# Patient Record
Sex: Male | Born: 1960
Health system: Southern US, Community
[De-identification: ages and names within clinical notes are randomized; demographics above are authoritative.]

## PROBLEM LIST (undated history)

## (undated) DIAGNOSIS — M47812 Spondylosis without myelopathy or radiculopathy, cervical region: Secondary | ICD-10-CM

## (undated) DIAGNOSIS — R413 Other amnesia: Secondary | ICD-10-CM

## (undated) DIAGNOSIS — K2270 Barrett's esophagus without dysplasia: Secondary | ICD-10-CM

## (undated) DIAGNOSIS — R300 Dysuria: Secondary | ICD-10-CM

## (undated) DIAGNOSIS — M5010 Cervical disc disorder with radiculopathy, unspecified cervical region: Secondary | ICD-10-CM

## (undated) DIAGNOSIS — I729 Aneurysm of unspecified site: Secondary | ICD-10-CM

## (undated) DIAGNOSIS — N411 Chronic prostatitis: Secondary | ICD-10-CM

## (undated) DIAGNOSIS — D649 Anemia, unspecified: Secondary | ICD-10-CM

## (undated) DIAGNOSIS — M21372 Foot drop, left foot: Secondary | ICD-10-CM

## (undated) DIAGNOSIS — M069 Rheumatoid arthritis, unspecified: Secondary | ICD-10-CM

## (undated) HISTORY — DX: Spondylosis without myelopathy or radiculopathy, cervical region: M47.812

## (undated) HISTORY — DX: Foot drop, left foot: M21.372

## (undated) HISTORY — DX: Other amnesia: R41.3

## (undated) HISTORY — DX: Chronic prostatitis: N41.1

## (undated) HISTORY — DX: Aneurysm of unspecified site: I72.9

## (undated) HISTORY — DX: Barrett's esophagus without dysplasia: K22.70

## (undated) HISTORY — PX: NOSE SURGERY: SHX723

## (undated) HISTORY — DX: Anemia, unspecified: D64.9

## (undated) HISTORY — DX: Cervical disc disorder with radiculopathy, unspecified cervical region: M50.10

## (undated) HISTORY — DX: Dysuria: R30.0

## (undated) HISTORY — PX: CERVICAL FUSION: SHX112

---

## 1998-06-17 ENCOUNTER — Emergency Department (HOSPITAL_COMMUNITY): Admission: EM | Admit: 1998-06-17 | Discharge: 1998-06-18 | Payer: Self-pay | Admitting: Emergency Medicine

## 1998-06-17 ENCOUNTER — Encounter: Payer: Self-pay | Admitting: Emergency Medicine

## 1998-08-17 ENCOUNTER — Ambulatory Visit (HOSPITAL_COMMUNITY): Admission: RE | Admit: 1998-08-17 | Discharge: 1998-08-17 | Payer: Self-pay | Admitting: Gastroenterology

## 1998-08-26 ENCOUNTER — Encounter: Payer: Self-pay | Admitting: Gastroenterology

## 1998-08-26 ENCOUNTER — Ambulatory Visit (HOSPITAL_COMMUNITY): Admission: RE | Admit: 1998-08-26 | Discharge: 1998-08-26 | Payer: Self-pay | Admitting: Gastroenterology

## 1999-10-08 ENCOUNTER — Emergency Department (HOSPITAL_COMMUNITY): Admission: EM | Admit: 1999-10-08 | Discharge: 1999-10-08 | Payer: Self-pay | Admitting: Emergency Medicine

## 2000-09-16 ENCOUNTER — Emergency Department (HOSPITAL_COMMUNITY): Admission: EM | Admit: 2000-09-16 | Discharge: 2000-09-16 | Payer: Self-pay | Admitting: Emergency Medicine

## 2000-09-20 ENCOUNTER — Emergency Department (HOSPITAL_COMMUNITY): Admission: EM | Admit: 2000-09-20 | Discharge: 2000-09-20 | Payer: Self-pay | Admitting: Emergency Medicine

## 2001-07-09 ENCOUNTER — Emergency Department (HOSPITAL_COMMUNITY): Admission: EM | Admit: 2001-07-09 | Discharge: 2001-07-09 | Payer: Self-pay | Admitting: *Deleted

## 2001-07-09 ENCOUNTER — Encounter: Payer: Self-pay | Admitting: Emergency Medicine

## 2001-07-09 ENCOUNTER — Encounter: Payer: Self-pay | Admitting: *Deleted

## 2003-02-23 ENCOUNTER — Emergency Department (HOSPITAL_COMMUNITY): Admission: EM | Admit: 2003-02-23 | Discharge: 2003-02-23 | Payer: Self-pay | Admitting: Emergency Medicine

## 2007-02-15 ENCOUNTER — Emergency Department (HOSPITAL_COMMUNITY): Admission: EM | Admit: 2007-02-15 | Discharge: 2007-02-15 | Payer: Self-pay | Admitting: Emergency Medicine

## 2010-07-09 ENCOUNTER — Emergency Department (HOSPITAL_COMMUNITY)
Admission: EM | Admit: 2010-07-09 | Discharge: 2010-07-09 | Disposition: A | Discharge status: Home / Self Care | Payer: Self-pay | Attending: Emergency Medicine | Admitting: Emergency Medicine

## 2010-07-09 ENCOUNTER — Emergency Department (HOSPITAL_COMMUNITY)
Admission: EM | Admit: 2010-07-09 | Discharge: 2010-07-10 | Disposition: A | Discharge status: Home / Self Care | Payer: Self-pay | Attending: Emergency Medicine | Admitting: Emergency Medicine

## 2010-07-09 ENCOUNTER — Emergency Department (HOSPITAL_COMMUNITY): Payer: Self-pay

## 2010-07-09 DIAGNOSIS — M25473 Effusion, unspecified ankle: Secondary | ICD-10-CM | POA: Insufficient documentation

## 2010-07-09 DIAGNOSIS — S99919A Unspecified injury of unspecified ankle, initial encounter: Secondary | ICD-10-CM | POA: Insufficient documentation

## 2010-07-09 DIAGNOSIS — R209 Unspecified disturbances of skin sensation: Secondary | ICD-10-CM | POA: Insufficient documentation

## 2010-07-09 DIAGNOSIS — M25476 Effusion, unspecified foot: Secondary | ICD-10-CM | POA: Insufficient documentation

## 2010-07-09 DIAGNOSIS — M069 Rheumatoid arthritis, unspecified: Secondary | ICD-10-CM | POA: Insufficient documentation

## 2010-07-09 DIAGNOSIS — S8990XA Unspecified injury of unspecified lower leg, initial encounter: Secondary | ICD-10-CM | POA: Insufficient documentation

## 2010-07-09 DIAGNOSIS — M25579 Pain in unspecified ankle and joints of unspecified foot: Secondary | ICD-10-CM | POA: Insufficient documentation

## 2010-07-09 DIAGNOSIS — S93409A Sprain of unspecified ligament of unspecified ankle, initial encounter: Secondary | ICD-10-CM | POA: Insufficient documentation

## 2010-07-09 DIAGNOSIS — S99929A Unspecified injury of unspecified foot, initial encounter: Secondary | ICD-10-CM | POA: Insufficient documentation

## 2010-07-09 DIAGNOSIS — W1789XA Other fall from one level to another, initial encounter: Secondary | ICD-10-CM | POA: Insufficient documentation

## 2010-07-09 DIAGNOSIS — E7800 Pure hypercholesterolemia, unspecified: Secondary | ICD-10-CM | POA: Insufficient documentation

## 2010-10-20 ENCOUNTER — Inpatient Hospital Stay (INDEPENDENT_AMBULATORY_CARE_PROVIDER_SITE_OTHER)
Admission: RE | Admit: 2010-10-20 | Discharge: 2010-10-20 | Disposition: A | Discharge status: Home / Self Care | Payer: Self-pay | Source: Ambulatory Visit | Attending: Emergency Medicine | Admitting: Emergency Medicine

## 2010-10-20 DIAGNOSIS — R6889 Other general symptoms and signs: Secondary | ICD-10-CM

## 2010-11-12 ENCOUNTER — Ambulatory Visit (INDEPENDENT_AMBULATORY_CARE_PROVIDER_SITE_OTHER): Payer: Self-pay

## 2010-11-12 ENCOUNTER — Inpatient Hospital Stay (INDEPENDENT_AMBULATORY_CARE_PROVIDER_SITE_OTHER)
Admission: RE | Admit: 2010-11-12 | Discharge: 2010-11-12 | Disposition: A | Discharge status: Home / Self Care | Payer: Self-pay | Source: Ambulatory Visit | Attending: Family Medicine | Admitting: Family Medicine

## 2010-11-12 DIAGNOSIS — H9209 Otalgia, unspecified ear: Secondary | ICD-10-CM

## 2010-11-12 DIAGNOSIS — G542 Cervical root disorders, not elsewhere classified: Secondary | ICD-10-CM

## 2010-11-12 DIAGNOSIS — F659 Paraphilia, unspecified: Secondary | ICD-10-CM

## 2010-11-12 DIAGNOSIS — R3919 Other difficulties with micturition: Secondary | ICD-10-CM

## 2010-11-12 LAB — POCT URINALYSIS DIP (DEVICE)
Bilirubin Urine: NEGATIVE
Glucose, UA: NEGATIVE mg/dL
Hgb urine dipstick: NEGATIVE
Leukocytes, UA: NEGATIVE
Nitrite: NEGATIVE
Protein, ur: NEGATIVE mg/dL
Specific Gravity, Urine: 1.02 (ref 1.005–1.030)
Urobilinogen, UA: 0.2 mg/dL (ref 0.0–1.0)
pH: 6 (ref 5.0–8.0)

## 2010-11-13 LAB — GC/CHLAMYDIA PROBE AMP, GENITAL
Chlamydia, DNA Probe: NEGATIVE
GC Probe Amp, Genital: NEGATIVE

## 2010-11-13 LAB — HIV ANTIBODY (ROUTINE TESTING W REFLEX): HIV: NONREACTIVE

## 2010-12-29 ENCOUNTER — Other Ambulatory Visit: Payer: Self-pay | Admitting: Neurosurgery

## 2010-12-29 DIAGNOSIS — M479 Spondylosis, unspecified: Secondary | ICD-10-CM

## 2011-01-14 ENCOUNTER — Ambulatory Visit
Admission: RE | Admit: 2011-01-14 | Discharge: 2011-01-14 | Disposition: A | Discharge status: Home / Self Care | Payer: BC Managed Care – PPO | Source: Ambulatory Visit | Attending: Neurosurgery | Admitting: Neurosurgery

## 2011-01-14 DIAGNOSIS — M479 Spondylosis, unspecified: Secondary | ICD-10-CM

## 2011-01-31 ENCOUNTER — Other Ambulatory Visit: Payer: Self-pay | Admitting: Internal Medicine

## 2011-02-25 ENCOUNTER — Other Ambulatory Visit: Payer: Self-pay | Admitting: Internal Medicine

## 2011-02-27 ENCOUNTER — Other Ambulatory Visit: Payer: Self-pay | Admitting: Internal Medicine

## 2011-04-20 ENCOUNTER — Encounter (HOSPITAL_COMMUNITY): Payer: Self-pay | Admitting: *Deleted

## 2011-04-20 ENCOUNTER — Emergency Department (HOSPITAL_COMMUNITY)
Admission: EM | Admit: 2011-04-20 | Discharge: 2011-04-20 | Disposition: A | Discharge status: Home / Self Care | Payer: BC Managed Care – PPO | Attending: Emergency Medicine | Admitting: Emergency Medicine

## 2011-04-20 DIAGNOSIS — G8918 Other acute postprocedural pain: Secondary | ICD-10-CM

## 2011-04-20 DIAGNOSIS — M542 Cervicalgia: Secondary | ICD-10-CM | POA: Insufficient documentation

## 2011-04-20 DIAGNOSIS — R209 Unspecified disturbances of skin sensation: Secondary | ICD-10-CM | POA: Insufficient documentation

## 2011-04-20 DIAGNOSIS — R609 Edema, unspecified: Secondary | ICD-10-CM | POA: Insufficient documentation

## 2011-04-20 DIAGNOSIS — M79609 Pain in unspecified limb: Secondary | ICD-10-CM | POA: Insufficient documentation

## 2011-04-20 DIAGNOSIS — Z981 Arthrodesis status: Secondary | ICD-10-CM | POA: Insufficient documentation

## 2011-04-20 HISTORY — DX: Rheumatoid arthritis, unspecified: M06.9

## 2011-04-20 NOTE — ED Notes (Signed)
Pt states "I had recent cervical fusion, it was all good x 6 days, was given steroids by an on call neurosurgeon but then my neurosurgeon d/c'd it because I was already on steroids for my RA, it's like he just washed his wands of me, so my primary care is going to take care of things until I can get in with the new rheumatologist, that appt is 10 days now; have pain in my right arm and am experiencing weakness I've never had before"

## 2011-04-20 NOTE — ED Provider Notes (Signed)
History     CSN: [id number]  Arrival date & time 04/20/11  1359   First MD Initiated Contact with Patient 04/20/11 1810      HPI Patient reports a history of a cervical fusion 6 days ago. Reports neurosurgeon the anterior approach. States since then he had initial improvement. However within the last 6 days has had worsening numbness and pain. Points to pain located at C6 and states feels as forearm. Also reports increasing weakness. Is uncertain if this is post surgical versus his rheumatoid arthritis. Denies fever, difficulty swallowing, shortness of breath, sore throat. Reports prednisone the pain however soon as medication wears off it returns. Patient is a 51 y.o. male presenting with shoulder pain. The history is provided by the patient.  Shoulder Pain This is a new problem. The current episode started in the past 7 days. The problem occurs constantly. The problem has been gradually worsening. Associated symptoms include neck pain, numbness and weakness. Pertinent negatives include no chills, fever, headaches, nausea, rash, sore throat or vomiting. He has tried oral narcotics for the symptoms.    Past Medical History  Diagnosis Date  . Rheumatoid arthritis   . Attention deficit disorder     Past Surgical History  Procedure Date  . Cervical fusion     No family history on file.  History  Substance Use Topics  . Smoking status: Former Games developer  . Smokeless tobacco: Not on file  . Alcohol Use: No      Review of Systems  Constitutional: Negative for fever and chills.  HENT: Positive for neck pain. Negative for sore throat and neck stiffness.   Gastrointestinal: Negative for nausea and vomiting.  Musculoskeletal: Negative for back pain.  Skin: Negative for rash.  Neurological: Positive for weakness and numbness. Negative for headaches.  All other systems reviewed and are negative.    Allergies  Review of patient's allergies indicates no known allergies.  Home  Medications   Current Outpatient Rx  Name Route Sig Dispense Refill  . ATOMOXETINE HCL 60 MG PO CAPS Oral Take 120 mg by mouth daily. Pt takes 2 capsules for 120 mg dose    . GABAPENTIN 600 MG PO TABS Oral Take 600 mg by mouth 3 (three) times daily.    Marland Kitchen PREDNISONE 50 MG PO TABS Oral Take 50 mg by mouth every 4 (four) hours.      BP 142/95  Pulse 107  Temp(Src) 98.1 F (36.7 C) (Oral)  Resp 20  Wt 185 lb (83.915 kg)  SpO2 98%  Physical Exam  Constitutional: He is oriented to person, place, and time. He appears well-developed and well-nourished.  HENT:  Head: Normocephalic and atraumatic.  Right Ear: External ear normal.  Left Ear: External ear normal.  Nose: Nose normal.  Mouth/Throat: Oropharynx is clear and moist. No oropharyngeal exudate.  Eyes: Pupils are equal, round, and reactive to light.  Neck: Neck supple. Spinous process tenderness present. No muscular tenderness present. No rigidity. Decreased range of motion present.         Mass or abscess palpated. Patient had anterior cervical fusion. Surgical scar present. Wound healing well. No signs of wound dehiscence, erythema, drainage. Patient has pain reproduced with palpation of C7. Patient states he feels the pain in his right forearm reports best pain relief with hyperextension of the shoulder.  Musculoskeletal:       Decreased strength of right-sided hand grip compared to left. Mild edema and noticeable in right shoulder. Normal sensation.  Neurological:  He is alert and oriented to person, place, and time.  Skin: Skin is warm and dry. No rash noted. No erythema. No pallor.  Psychiatric: He has a normal mood and affect. His behavior is normal.    ED Course  Procedures   MDM    I scheduled patient to return tomorrow for an MRI. Patient states he does not want to have it done today because he has to be at work and 30 minutes and he has been waiting for 4 hours. Patient is reasonable and does not request any pain  medications. States he is  just concerned due to new numbness and weakness. Patient does arrive for his MRI at 6:45 PM tomorrow morning.      Thomasene Lot, PA-C 04/25/11 1044

## 2011-04-21 ENCOUNTER — Ambulatory Visit (HOSPITAL_COMMUNITY)
Admit: 2011-04-21 | Discharge: 2011-04-21 | Disposition: A | Discharge status: Home / Self Care | Payer: BC Managed Care – PPO | Attending: Emergency Medicine | Admitting: Emergency Medicine

## 2011-04-21 DIAGNOSIS — M79609 Pain in unspecified limb: Secondary | ICD-10-CM | POA: Insufficient documentation

## 2011-04-21 DIAGNOSIS — R29898 Other symptoms and signs involving the musculoskeletal system: Secondary | ICD-10-CM | POA: Insufficient documentation

## 2011-04-21 DIAGNOSIS — R209 Unspecified disturbances of skin sensation: Secondary | ICD-10-CM | POA: Insufficient documentation

## 2011-04-21 DIAGNOSIS — M129 Arthropathy, unspecified: Secondary | ICD-10-CM | POA: Insufficient documentation

## 2011-04-21 DIAGNOSIS — Z981 Arthrodesis status: Secondary | ICD-10-CM | POA: Insufficient documentation

## 2011-04-21 MED ORDER — GADOBENATE DIMEGLUMINE 529 MG/ML IV SOLN
17.0000 mL | Freq: Once | INTRAVENOUS | Status: AC | PRN
  Administered 2011-04-21: 17 mL via INTRAVENOUS

## 2011-04-26 NOTE — ED Provider Notes (Signed)
Medical screening examination/treatment/procedure(s) were performed by non-physician practitioner and as supervising physician I was immediately available for consultation/collaboration.   Kamaya Keckler, MD 04/26/11 0900 

## 2011-05-17 ENCOUNTER — Other Ambulatory Visit: Payer: Self-pay | Admitting: Neurology

## 2011-05-17 DIAGNOSIS — G35 Multiple sclerosis: Secondary | ICD-10-CM

## 2011-05-24 ENCOUNTER — Ambulatory Visit
Admission: RE | Admit: 2011-05-24 | Discharge: 2011-05-24 | Disposition: A | Discharge status: Home / Self Care | Payer: BC Managed Care – PPO | Source: Ambulatory Visit | Attending: Neurology | Admitting: Neurology

## 2011-05-24 DIAGNOSIS — G35 Multiple sclerosis: Secondary | ICD-10-CM

## 2011-06-14 ENCOUNTER — Other Ambulatory Visit: Payer: Self-pay | Admitting: Neurosurgery

## 2011-06-14 DIAGNOSIS — M5410 Radiculopathy, site unspecified: Secondary | ICD-10-CM

## 2011-06-14 DIAGNOSIS — M542 Cervicalgia: Secondary | ICD-10-CM

## 2011-06-21 ENCOUNTER — Ambulatory Visit: Payer: BC Managed Care – PPO | Admitting: Family

## 2011-06-24 ENCOUNTER — Ambulatory Visit
Admission: RE | Admit: 2011-06-24 | Discharge: 2011-06-24 | Disposition: A | Discharge status: Home / Self Care | Payer: BC Managed Care – PPO | Source: Ambulatory Visit | Attending: Neurosurgery | Admitting: Neurosurgery

## 2011-06-24 VITALS — BP 124/85 | HR 91

## 2011-06-24 DIAGNOSIS — M542 Cervicalgia: Secondary | ICD-10-CM

## 2011-06-24 DIAGNOSIS — M5410 Radiculopathy, site unspecified: Secondary | ICD-10-CM

## 2011-06-24 MED ORDER — DIAZEPAM 5 MG PO TABS
10.0000 mg | ORAL_TABLET | Freq: Once | ORAL | Status: AC
  Administered 2011-06-24: 10 mg via ORAL

## 2011-06-24 MED ORDER — IOHEXOL 300 MG/ML  SOLN
10.0000 mL | Freq: Once | INTRAMUSCULAR | Status: AC | PRN
  Administered 2011-06-24: 10 mL via INTRATHECAL

## 2011-06-24 NOTE — Discharge Instructions (Signed)

## 2011-07-12 ENCOUNTER — Other Ambulatory Visit: Payer: Self-pay | Admitting: Orthopaedic Surgery

## 2011-07-12 DIAGNOSIS — M21379 Foot drop, unspecified foot: Secondary | ICD-10-CM

## 2011-07-12 DIAGNOSIS — M542 Cervicalgia: Secondary | ICD-10-CM

## 2011-07-13 ENCOUNTER — Other Ambulatory Visit: Payer: Self-pay | Admitting: Orthopaedic Surgery

## 2011-07-16 ENCOUNTER — Ambulatory Visit
Admission: RE | Admit: 2011-07-16 | Discharge: 2011-07-16 | Disposition: A | Discharge status: Home / Self Care | Payer: BC Managed Care – PPO | Source: Ambulatory Visit | Attending: Orthopaedic Surgery | Admitting: Orthopaedic Surgery

## 2011-07-16 DIAGNOSIS — M21379 Foot drop, unspecified foot: Secondary | ICD-10-CM

## 2011-07-16 DIAGNOSIS — M542 Cervicalgia: Secondary | ICD-10-CM

## 2011-07-20 ENCOUNTER — Other Ambulatory Visit: Payer: Self-pay | Admitting: Orthopaedic Surgery

## 2011-07-20 DIAGNOSIS — IMO0002 Reserved for concepts with insufficient information to code with codable children: Secondary | ICD-10-CM

## 2011-07-25 ENCOUNTER — Inpatient Hospital Stay: Admission: RE | Admit: 2011-07-25 | Payer: BC Managed Care – PPO | Source: Ambulatory Visit

## 2011-07-26 ENCOUNTER — Ambulatory Visit
Admission: RE | Admit: 2011-07-26 | Discharge: 2011-07-26 | Disposition: A | Discharge status: Home / Self Care | Payer: BC Managed Care – PPO | Source: Ambulatory Visit | Attending: Orthopaedic Surgery | Admitting: Orthopaedic Surgery

## 2011-07-26 DIAGNOSIS — IMO0002 Reserved for concepts with insufficient information to code with codable children: Secondary | ICD-10-CM

## 2012-03-23 ENCOUNTER — Emergency Department (INDEPENDENT_AMBULATORY_CARE_PROVIDER_SITE_OTHER)
Admission: EM | Admit: 2012-03-23 | Discharge: 2012-03-23 | Disposition: A | Discharge status: Home / Self Care | Payer: 59 | Source: Home / Self Care | Attending: Family Medicine | Admitting: Family Medicine

## 2012-03-23 ENCOUNTER — Encounter (HOSPITAL_COMMUNITY): Payer: Self-pay | Admitting: Emergency Medicine

## 2012-03-23 DIAGNOSIS — B001 Herpesviral vesicular dermatitis: Secondary | ICD-10-CM

## 2012-03-23 DIAGNOSIS — B009 Herpesviral infection, unspecified: Secondary | ICD-10-CM

## 2012-03-23 MED ORDER — ACYCLOVIR 400 MG PO TABS: 800.0000 mg | ORAL_TABLET | Freq: Three times a day (TID) | ORAL | Status: DC

## 2012-03-23 NOTE — ED Provider Notes (Signed)
History     CSN: [id number]  Arrival date & time 03/23/12  1811   First MD Initiated Contact with Patient 03/23/12 1931      Chief Complaint  Patient presents with  . Blister    on mouth  . Lymphadenopathy    (Consider location/radiation/quality/duration/timing/severity/associated sxs/prior treatment) The history is provided by the patient.  pt reports blister noted to bottom lip yesterday, states he believes it is getting bigger.  Spoke with primary care provider and was sent to [facility] for further evaluation.  Additionally complains of swollen lymph node in neck.  No known fever, sore throat or uri.  No hx of same.    Past Medical History  Diagnosis Date  . Rheumatoid arthritis   . Attention deficit disorder     Past Surgical History  Procedure Date  . Cervical fusion     History reviewed. No pertinent family history.  History  Substance Use Topics  . Smoking status: Former Games developer  . Smokeless tobacco: Not on file  . Alcohol Use: No      Review of Systems  All other systems reviewed and are negative.    Allergies  Review of patient's allergies indicates no known allergies.  Home Medications   Current Outpatient Rx  Name  Route  Sig  Dispense  Refill  . ATOMOXETINE HCL 60 MG PO CAPS   Oral   Take 120 mg by mouth daily. Pt takes 2 capsules for 120 mg dose         . BACLOFEN PO   Oral   Take by mouth.         . CELEBREX PO   Oral   Take by mouth.         Marland Kitchen ROXICODONE PO   Oral   Take by mouth.         Marland Kitchen PREDNISONE 50 MG PO TABS   Oral   Take 50 mg by mouth every 4 (four) hours.         Marland Kitchen PREGABALIN 150 MG PO CAPS   Oral   Take 150 mg by mouth 2 (two) times daily.         . ACYCLOVIR 400 MG PO TABS   Oral   Take 2 tablets (800 mg total) by mouth 3 (three) times daily. For 7 days   50 tablet   0   . GABAPENTIN 600 MG PO TABS   Oral   Take 600 mg by mouth 3 (three) times daily.           BP 115/67  Pulse 80  Temp 98.3  F (36.8 C) (Oral)  Resp 20  SpO2 98%  Physical Exam  Nursing note and vitals reviewed. Constitutional: He is oriented to person, place, and time. Vital signs are normal. He appears well-developed and well-nourished. He is active and cooperative. No distress.  HENT:  Head: Normocephalic.  Right Ear: External ear normal. A middle ear effusion is present.  Left Ear: Tympanic membrane and external ear normal.  Nose: Nose normal. Right sinus exhibits no maxillary sinus tenderness and no frontal sinus tenderness. Left sinus exhibits no maxillary sinus tenderness and no frontal sinus tenderness.  Mouth/Throat: Uvula is midline, oropharynx is clear and moist and mucous membranes are normal.    Eyes: Conjunctivae normal are normal. Pupils are equal, round, and reactive to light. No scleral icterus.  Neck: Trachea normal and normal range of motion. Neck supple.  Cardiovascular: Normal rate, regular rhythm, normal heart  sounds, intact distal pulses and normal pulses.   Pulmonary/Chest: Effort normal and breath sounds normal.  Lymphadenopathy:       Head (right side): No submental, no submandibular, no tonsillar, no preauricular, no posterior auricular and no occipital adenopathy present.       Head (left side): Tonsillar adenopathy present. No submental, no submandibular, no preauricular, no posterior auricular and no occipital adenopathy present.    He has no cervical adenopathy.  Neurological: He is alert and oriented to person, place, and time. No cranial nerve deficit or sensory deficit.  Skin: Skin is warm and dry. He is not diaphoretic.  Psychiatric: He has a normal mood and affect. His speech is normal and behavior is normal. Judgment and thought content normal. Cognition and memory are normal.    ED Course  Procedures (including critical care time)  Labs Reviewed - No data to display No results found.   1. Fever blister       MDM  Medication as prescribed, pt declined hsv lab.   Will treat empirically with acyclovir per pt request.  Follow up with pcp if symptoms are not improving.        Johnsie Kindred, NP 04/01/12 1104

## 2012-03-23 NOTE — ED Notes (Signed)
Reports fever blister on mouth since yesterday.  C/o lymph nodes swollen which started this morning.

## 2012-04-03 NOTE — ED Provider Notes (Signed)
Medical screening examination/treatment/procedure(s) were performed by resident physician or non-physician practitioner and as supervising physician I was immediately available for consultation/collaboration.   Barkley Bruns MD.    Linna Hoff, MD 04/03/12 1246

## 2012-04-08 ENCOUNTER — Encounter (HOSPITAL_COMMUNITY): Payer: Self-pay | Admitting: *Deleted

## 2012-04-08 ENCOUNTER — Emergency Department (INDEPENDENT_AMBULATORY_CARE_PROVIDER_SITE_OTHER)
Admission: EM | Admit: 2012-04-08 | Discharge: 2012-04-08 | Disposition: A | Discharge status: Home / Self Care | Payer: 59 | Source: Home / Self Care | Attending: Emergency Medicine | Admitting: Emergency Medicine

## 2012-04-08 DIAGNOSIS — J029 Acute pharyngitis, unspecified: Secondary | ICD-10-CM

## 2012-04-08 LAB — POCT RAPID STREP A: Streptococcus, Group A Screen (Direct): NEGATIVE

## 2012-04-08 MED ORDER — PENICILLIN V POTASSIUM 500 MG PO TABS: 500.0000 mg | ORAL_TABLET | Freq: Four times a day (QID) | ORAL | Status: AC

## 2012-04-08 NOTE — ED Provider Notes (Signed)
History     CSN: [id number]  Arrival date & time 04/08/12  1116   First MD Initiated Contact with Patient 04/08/12 1212      Chief Complaint  Patient presents with  . Sore Throat    (Consider location/radiation/quality/duration/timing/severity/associated sxs/prior treatment) Patient is a 51 y.o. male presenting with pharyngitis. The history is provided by the patient. No language interpreter was used.  Sore Throat This is a new problem. The problem occurs constantly. The problem has been gradually worsening. Pertinent negatives include no headaches and no shortness of breath. Nothing aggravates the symptoms. Nothing relieves the symptoms.  Pt reports he was exposed to strep     Past Medical History  Diagnosis Date  . Rheumatoid arthritis   . Attention deficit disorder     Past Surgical History  Procedure Date  . Cervical fusion     No family history on file.  History  Substance Use Topics  . Smoking status: Former Games developer  . Smokeless tobacco: Not on file  . Alcohol Use: No      Review of Systems  HENT: Positive for sore throat.   Respiratory: Negative for shortness of breath.   Neurological: Negative for headaches.  All other systems reviewed and are negative.    Allergies  Review of patient's allergies indicates no known allergies.  Home Medications   Current Outpatient Rx  Name  Route  Sig  Dispense  Refill  . ACYCLOVIR 400 MG PO TABS   Oral   Take 2 tablets (800 mg total) by mouth 3 (three) times daily. For 7 days   50 tablet   0   . ATOMOXETINE HCL 60 MG PO CAPS   Oral   Take 120 mg by mouth daily. Pt takes 2 capsules for 120 mg dose         . BACLOFEN PO   Oral   Take by mouth.         . CELEBREX PO   Oral   Take by mouth.         Marland Kitchen GABAPENTIN 600 MG PO TABS   Oral   Take 600 mg by mouth 3 (three) times daily.         Marland Kitchen ROXICODONE PO   Oral   Take by mouth.         Marland Kitchen PREDNISONE 50 MG PO TABS   Oral   Take 50 mg by  mouth every 4 (four) hours.         Marland Kitchen PREGABALIN 150 MG PO CAPS   Oral   Take 150 mg by mouth 2 (two) times daily.           BP 126/84  Pulse 82  Temp 98.8 F (37.1 C) (Oral)  SpO2 99%  Physical Exam  Nursing note and vitals reviewed. Constitutional: He is oriented to person, place, and time. He appears well-developed and well-nourished.  HENT:  Head: Normocephalic and atraumatic.  Right Ear: External ear normal.  Left Ear: External ear normal.  Nose: Nose normal.  Mouth/Throat: Oropharynx is clear and moist.  Eyes: Conjunctivae normal and EOM are normal. Pupils are equal, round, and reactive to light.  Neck: Normal range of motion. Neck supple.  Cardiovascular: Normal rate.   Pulmonary/Chest: Effort normal.  Musculoskeletal: Normal range of motion.  Neurological: He is alert and oriented to person, place, and time. He has normal reflexes.  Skin: Skin is warm.  Psychiatric: He has a normal mood and affect.  ED Course  Procedures (including critical care time)   Labs Reviewed  POCT RAPID STREP A ([facility] ONLY)   No results found.   No diagnosis found.    MDM  Strep negative   I will treat with pcn          Elson Areas, PA 04/08/12 1325

## 2012-04-08 NOTE — ED Notes (Addendum)
Patient complains of sore throat and head congestion, body aches x 1 day. Hurts to swallow. Patient denies fever/chills, nausea/vomiting, diarrhea.

## 2012-04-08 NOTE — ED Provider Notes (Signed)
Medical screening examination/treatment/procedure(s) were performed by non-physician practitioner and as supervising physician I was immediately available for consultation/collaboration.  Abrianna Sidman, M.D.   Maci Eickholt C Lekendrick Alpern, MD 04/08/12 2046 

## 2012-04-18 DIAGNOSIS — Z5181 Encounter for therapeutic drug level monitoring: Secondary | ICD-10-CM | POA: Insufficient documentation

## 2012-04-18 DIAGNOSIS — R4701 Aphasia: Secondary | ICD-10-CM | POA: Insufficient documentation

## 2012-04-18 DIAGNOSIS — R413 Other amnesia: Secondary | ICD-10-CM | POA: Insufficient documentation

## 2012-05-18 ENCOUNTER — Other Ambulatory Visit: Payer: Self-pay | Admitting: Orthopaedic Surgery

## 2012-05-18 DIAGNOSIS — M5030 Other cervical disc degeneration, unspecified cervical region: Secondary | ICD-10-CM

## 2012-05-24 ENCOUNTER — Other Ambulatory Visit: Payer: 59

## 2012-05-24 ENCOUNTER — Inpatient Hospital Stay
Admission: RE | Admit: 2012-05-24 | Discharge: 2012-05-24 | Disposition: A | Discharge status: Home / Self Care | Payer: 59 | Source: Ambulatory Visit | Attending: Orthopaedic Surgery | Admitting: Orthopaedic Surgery

## 2012-06-01 ENCOUNTER — Ambulatory Visit
Admission: RE | Admit: 2012-06-01 | Discharge: 2012-06-01 | Disposition: A | Discharge status: Home / Self Care | Payer: 59 | Source: Ambulatory Visit | Attending: Orthopaedic Surgery | Admitting: Orthopaedic Surgery

## 2012-06-01 DIAGNOSIS — M5030 Other cervical disc degeneration, unspecified cervical region: Secondary | ICD-10-CM

## 2012-06-01 MED ORDER — IOHEXOL 300 MG/ML  SOLN
10.0000 mL | Freq: Once | INTRAMUSCULAR | Status: AC | PRN
  Administered 2012-06-01: 10 mL via INTRATHECAL

## 2012-06-01 NOTE — Progress Notes (Signed)
Pt refused valium

## 2012-06-01 NOTE — Progress Notes (Signed)
Pt has been off bupropion for the past 2 days but did not have to stop the strattera.

## 2012-08-06 ENCOUNTER — Telehealth: Payer: Self-pay | Admitting: *Deleted

## 2012-08-06 NOTE — Telephone Encounter (Signed)
Message left that we received a referral from Dr. Nicholos Johns requesting a sleep consult.  Please call to schedule with Dr. Vickey Huger.

## 2012-08-08 ENCOUNTER — Encounter: Payer: Self-pay | Admitting: Neurology

## 2012-08-08 ENCOUNTER — Encounter (INDEPENDENT_AMBULATORY_CARE_PROVIDER_SITE_OTHER): Payer: 59 | Admitting: Neurology

## 2012-08-08 DIAGNOSIS — R413 Other amnesia: Secondary | ICD-10-CM

## 2012-08-08 DIAGNOSIS — R4701 Aphasia: Secondary | ICD-10-CM

## 2012-08-08 NOTE — Progress Notes (Signed)
Reason for visit: Word finding problems  Charles Stevens is an 52 y.o. male  History of present illness:  Charles Stevens is a 52 year old right-handed white male with a history of problems with concentration and word finding that dates back several months. The patient has had blood work that was unremarkable. The patient was set up for MRI evaluation of the brain, but this was never done. The patient indicates that since he was here last, he has cut back on the Lyrica taking only 75 mg daily. The patient has had an improvement in his work finding problems and concentration abilities. The patient however, goes on to say that he has been referred for a sleep study. The patient has been snoring at night, and he has had observed apnea at night while sleeping. The patient wakes up feeling "foggy headed" in the morning, and he has a headache that lasts about 1 hour in the morning. The patient has a job that alters his sleep wake cycle, occasionally working at night, and occasionally working during the day. The patient returns for an evaluation.  Past Medical History  Diagnosis Date  . Rheumatoid arthritis   . Attention deficit disorder   . Memory loss   . Cervical spondylosis   . Dysuria     Hx. of Dysuria  . Chronic prostatitis     Possible chronic prostatitis    Past Surgical History  Procedure Laterality Date  . Cervical fusion      Family History  Problem Relation Age of Onset  . Cancer Mother     Social history:  reports that he has quit smoking. His smoking use included Pipe. He does not have any smokeless tobacco history on file. He reports that he does not drink alcohol or use illicit drugs.  Allergies: No Known Allergies  Medications:  Current Outpatient Prescriptions on File Prior to Visit  Medication Sig Dispense Refill  . atomoxetine (STRATTERA) 60 MG capsule Take 120 mg by mouth daily. Pt takes 2 capsules for 120 mg dose      . pregabalin (LYRICA) 150 MG capsule Take 150 mg  by mouth daily.        No current facility-administered medications on file prior to visit.    ROS:  Out of a complete 14 system review of symptoms, the patient complains only of the following symptoms, and all other reviewed systems are negative.  Fatigue Blurred vision Concentration problems  Blood pressure 132/70, pulse 89, height 5\' 7"  (1.702 m), weight 165 lb (74.844 kg).  Physical Exam  General: The patient is alert and cooperative at the time of the examination.  Skin: No significant peripheral edema is noted.   Neurologic Exam  Cranial nerves: Facial symmetry is present. Speech is normal, no aphasia or dysarthria is noted. Extraocular movements are full. Visual fields are full.  Motor: The patient has good strength in all 4 extremities.  Coordination: The patient has good finger-nose-finger and heel-to-shin bilaterally.  Gait and station: The patient has a normal gait. Tandem gait is normal. Romberg is negative. No drift is seen.  Reflexes: Deep tendon reflexes are symmetric.   Assessment/Plan:  1. Mild memory disturbance, word finding problems  2. Probable sleep apnea  The patient has early morning headaches, and a history of snoring and observed apnea at night while sleeping. The patient likely has obstructive sleep apnea. The patient also has a work schedule that inverts the sleep wake cycle from nighttime to the daytime and back again.  The patient has difficulty sleeping because of this. The patient has been referred for a sleep study, and this is an appropriate next step in his workup. The patient will followup with me on an as-needed basis. If sleep apnea is noted, and treatment does not improve the symptoms, we may go on to do the MRI of the brain in the future.  Marlan Palau MD 08/08/2012 7:41 PM  [facility] [address] [place], [place] [address]  Phone [phone] Fax [phone]

## 2012-08-08 NOTE — Progress Notes (Signed)
This encounter was created in error - please disregard.

## 2012-08-10 ENCOUNTER — Telehealth: Payer: Self-pay | Admitting: *Deleted

## 2012-08-10 NOTE — Telephone Encounter (Signed)
Message left that we received a referral from Dr. Nicholos Johns.  Please call to schedule a Sleep Consult with Dr. Vickey Huger

## 2012-08-15 ENCOUNTER — Telehealth: Payer: Self-pay | Admitting: *Deleted

## 2012-08-15 NOTE — Telephone Encounter (Signed)
Message left that we are following up on a referral from Dr. Nicholos Johns requesting an appointment.  Please call to schedule.

## 2012-08-18 ENCOUNTER — Telehealth: Payer: Self-pay | Admitting: *Deleted

## 2012-08-18 NOTE — Telephone Encounter (Signed)
I called and left a message for the patient to callback on Monday to the sleep lab to schedule sleep consult with Dr. Vickey Huger.

## 2012-08-31 ENCOUNTER — Encounter: Payer: Self-pay | Admitting: Neurology

## 2012-08-31 ENCOUNTER — Ambulatory Visit (INDEPENDENT_AMBULATORY_CARE_PROVIDER_SITE_OTHER): Payer: No Typology Code available for payment source | Admitting: Neurology

## 2012-08-31 VITALS — BP 128/86 | HR 76 | Temp 98.4°F | Ht 72.0 in | Wt 179.1 lb

## 2012-08-31 DIAGNOSIS — R0609 Other forms of dyspnea: Secondary | ICD-10-CM

## 2012-08-31 DIAGNOSIS — R413 Other amnesia: Secondary | ICD-10-CM | POA: Insufficient documentation

## 2012-08-31 DIAGNOSIS — R0683 Snoring: Secondary | ICD-10-CM

## 2012-08-31 DIAGNOSIS — G4710 Hypersomnia, unspecified: Secondary | ICD-10-CM

## 2012-08-31 DIAGNOSIS — R0989 Other specified symptoms and signs involving the circulatory and respiratory systems: Secondary | ICD-10-CM

## 2012-08-31 NOTE — Addendum Note (Signed)
Addended by: Melvyn Novas on: 08/31/2012 03:19 PM   Modules accepted: Orders

## 2012-08-31 NOTE — Patient Instructions (Addendum)
Sleep Apnea Sleep apnea is disorder that affects a person's sleep. A person with sleep apnea has abnormal pauses in their breathing when they sleep. It is hard for them to get a good sleep. This makes a person tired during the day. It also can lead to other physical problems. There are three types of sleep apnea. One type is when breathing stops for a short time because your airway is blocked (obstructive sleep apnea). Another type is when the brain sometimes fails to give the normal signal to breathe to the muscles that control your breathing (central sleep apnea). The third type is a combination of the other two types. HOME CARE  Do not sleep on your back. Try to sleep on your side.  Take all medicine as told by your doctor.  Avoid alcohol, calming medicines (sedatives), and depressant drugs.  Try to lose weight if you are overweight. Talk to your doctor about a healthy weight goal. Your doctor may have you use a device that helps to open your airway. It can help you get the air that you need. It is called a positive airway pressure (PAP) device. There are three types of PAP devices:  Continuous positive airway pressure (CPAP) device.  Nasal expiratory positive airway pressure (EPAP) device.  Bilevel positive airway pressure (BPAP) device. MAKE SURE YOU:  Understand these instructions.  Will watch your condition.  Will get help right away if you are not doing well or get worse. Document Released: 01/05/2008 Document Revised: 03/14/2012 Document Reviewed: 07/30/2011 [facility] Patient Information 2014 [place], [place]. Hypersomnia Hypersomnia usually brings recurrent episodes of excessive daytime sleepiness or prolonged nighttime sleep. It is different than feeling tired due to lack of or interrupted sleep at night. People with hypersomnia are compelled to nap repeatedly during the day. This is often at inappropriate times such as:  At work.  During a meal.  In conversation. These  daytime naps usually provide no relief. This disorder typically affects adolescents and young adults. CAUSES  This condition may be caused by:  Another sleep disorder (such as narcolepsy or sleep apnea).  Dysfunction of the autonomic nervous system.  Drug or alcohol abuse.  A physical problem, such as:  A tumor.  Head trauma. This is damage caused by an accident.  Injury to the central nervous system.  Certain medications, or medicine withdrawal.  Medical conditions may contribute to the disorder, including:  Multiple sclerosis.  Depression.  Encephalitis.  Epilepsy.  Obesity.  Some people appear to have a genetic predisposition to this disorder. In others, there is no known cause. SYMPTOMS   Patients often have difficulty waking from a long sleep. They may feel dazed or confused.  Other symptoms may include:  Anxiety.  Increased irritation (inflammation).  Decreased energy.  Restlessness.  Slow thinking.  Slow speech.  Loss of appetite.  Hallucinations.  Memory difficulty.  Tremors, Tics.  Some patients lose the ability to function in family, social, occupational, or other settings. TREATMENT  Treatment is symptomatic in nature. Stimulants and other drugs may be used to treat this disorder. Changes in behavior may help. For example, avoid night work and social activities that delay bed time. Changes in diet may offer some relief. Patients should avoid alcohol and caffeine. PROGNOSIS  The likely outcome (prognosis) for persons with hypersomnia depends on the cause of the disorder. The disorder itself is not life threatening. But it can have serious consequences. For example, automobile accidents can be caused by falling asleep while driving. The  attacks usually continue indefinitely. Document Released: 03/18/2002 Document Revised: 06/20/2011 Document Reviewed: 02/20/2008 [facility] Patient Information 2014 [place], [place].

## 2012-08-31 NOTE — Progress Notes (Addendum)
[facility]  Provider:  Dr Jayleen Scaglione Referring Provider: Georgianne Fick, MD Primary Care Physician:  Charles Fick, MD  Chief Complaint  Patient presents with  . Insomnia    Sleep apnea #10    HPI:  Charles Stevens is a 52 y.o. male here as a referral from Dr. Nicholos Stevens for  Sleep apnea evaluation.  He is an established patient with Dr Charles Stevens at [facility], for memory loss and word finding problems. Charles Stevens a 20-year-old right-handed Caucasian male has a history of a DD, multiple brain concussions and progressive difficulties with word finding. The patient was sent to Dr. Anne Stevens for evaluation of his word finding problems and memory issues in generators thousand 14 by his primary care physician, Dr. Dicky Stevens from. Dr. Anne Stevens established that the patient had a high level of fatigue. The patient endorsed today the Epworth Sleepiness Scale at 9-10 points, his fatigue severity score however was 56. The patient is taking Strattera and is in the process of changing to Adderall he is also on Voltaren , Celebrex Deltasone and oxycodone as of his last visit. He is  On HUMIRA for the treatment of rheumatoid arthritis.  He follows with  Dr Docia Chuck.  The patient works at [employer] , in second shift.   The patient scored  29/30 points in his MMSE and was supposed to follow up with Dr. Anne Stevens for evaluation of  demyelination disease. He has outstanding tests to go through with under Dr Charles Stevens guidance.   The patient was to bed usually around midnight, he may asleep fall asleep as quicker than 2-3 minutes sometimes longer. He has chronic prostatitis and as long as he's treated with antibiotics he has normal to a. He has been told that he is a loud snorer and apneas have have been witnessed. Is awoken himself in supine position from snoring The claves to sleep for up to 12 hours which is not a realistic goal for him, but he feels not refreshed after 7 or 8 hours of sleep. His  girlfriend has a different javelin sometimes wakes him up inadvertently. Again he feels not refreshed even after 7 or 8 hours of uninterrupted sleep. He is highly fatigued rather than having sleep attacks. On some days he will take a daytime nap, he drinks red bull and coffee a lot all day he states to not have sudden sleep attacks or he resisted the urge to fall asleep.  Without caffeine or energy drinks he will fall asleep promptly if he lies down or sits down. Than his Epworth is 16 or higher.   Patient considers himself excessively daytime sleepy since ever he can remember certainly he had examples at 52 years of age for he fell asleep promptly and unprovoked.            Review of Systems: Out of a complete 14 system review, the patient complains of only the following symptoms, and all other reviewed systems are negative. EDS 9-16 points.  Fatigue autoimmune disease.  History   Social History  . Marital Status: Single    Spouse Name: N/A    Number of Children: 0  . Years of Education: assoc   Occupational History  . [employer]    Social History Main Topics  . Smoking status: Former Smoker    Types: Pipe  . Smokeless tobacco: Not on file     Comment: Quit in April  . Alcohol Use: No  . Drug Use: No  . Sexually Active:  Not on file   Other Topics Concern  . Not on file   Social History Narrative  . No narrative on file    Family History  Problem Relation Age of Onset  . Cancer Mother     Past Medical History  Diagnosis Date  . Rheumatoid arthritis   . Attention deficit disorder   . Memory loss   . Cervical spondylosis   . Dysuria     Hx. of Dysuria  . Chronic prostatitis     Possible chronic prostatitis  . Memory loss of unknown cause     dr Charles Stevens 2014 . NP Darrol Angel     Past Surgical History  Procedure Laterality Date  . Cervical fusion      Current Outpatient Prescriptions  Medication Sig Dispense Refill  . baclofen (LIORESAL) 10  MG tablet Take 5 mg by mouth 3 (three) times daily.      . celecoxib (CELEBREX) 200 MG capsule Take 200 mg by mouth daily.      Marland Kitchen HUMIRA PEN 40 MG/0.8ML injection Inject 40 mg into the skin every 14 (fourteen) days.       Marland Kitchen oxyCODONE (ROXICODONE) 15 MG immediate release tablet Take 15 mg by mouth 2 (two) times daily as needed for pain.      . predniSONE (DELTASONE) 5 MG tablet Take 5 mg by mouth daily.      . pregabalin (LYRICA) 150 MG capsule Take 150 mg by mouth daily.       . tadalafil (CIALIS) 10 MG tablet Take 10 mg by mouth daily as needed for erectile dysfunction.      . VOLTAREN 1 % GEL Apply 2 g topically 2 (two) times daily as needed.        No current facility-administered medications for this visit.    Allergies as of 08/31/2012  . (No Known Allergies)    Vitals: BP 128/86  Pulse 76  Temp(Src) 98.4 F (36.9 C) (Oral)  Ht 6' (1.829 m)  Wt 179 lb 1.6 oz (81.239 kg)  BMI 24.28 kg/m2 Last Weight:  Wt Readings from Last 1 Encounters:  08/31/12 179 lb 1.6 oz (81.239 kg)   Last Height:   Ht Readings from Last 1 Encounters:  08/31/12 6' (1.829 m)     Physical exam:  General: The patient is awake, alert and appears not in acute distress. The patient is well groomed. Head: Normocephalic, atraumatic.  Neck is supple. Mallampati 2 , neck circumference:14.5 , a sugars a history of neck surgery the no sinus or jaw or nasal surgeries., Nor retrognathia.  History of TMJ,  Clicking in the  Left jaw.  Cardiovascular:  Regular rate and rhythm, without  murmurs or carotid bruit, and without distended neck veins. Respiratory: Lungs are clear to auscultation. Skin:  Without evidence of edema, or rash Trunk: BMI is normal .  Neurologic exam : The patient is awake and alert, oriented to place and time.  Memory subjective described as impaired . There is a normal attention span & concentration ability. Speech is fluent without dysarthria, dysphonia . Mood and affect are  Depressed,  fatigued.   Cranial nerves: Pupils are equal and briskly reactive to light. Funduscopic exam without  evidence of pallor or edema. Extraocular movements  in vertical and horizontal planes intact and without nystagmus. Visual fields by finger perimetry are intact. Hearing to finger rub intact.  Facial sensation intact to fine touch. Facial motor strength is symmetric and tongue and uvula move midline.  Motor exam:  Normal tone and normal muscle bulk and symmetric normal strength in all extremities.  Sensory:  Fine touch, pinprick and vibration were tested in all extremities. He has hand numbness-  Proprioception is tested in the upper extremities only. This was  normal.  Coordination: Rapid alternating movements in the fingers/hands is tested and normal.  Gait and station: Patient walks without assistive deviceDeep tendon reflexes: in the  upper and lower extremities are symmetric and intact.   Assessment:  After physical and neurologic examination, review of laboratory studies, imaging, neurophysiology testing and pre-existing records, assessment will be reviewed on the problem list.  Plan:  Treatment plan and additional workup will be reviewed under Problem List.  Discussion of possible apnea  , health  risks  Sec. to OSA , and treatment options  35 minutes.   Patient has an excessively long uvula that touches the tongue ground, but he does not have a lateral narrow airway. TMJ will make use of a dental device less tolerated.

## 2012-09-12 ENCOUNTER — Encounter: Payer: No Typology Code available for payment source | Admitting: Neurology

## 2012-09-19 ENCOUNTER — Ambulatory Visit (INDEPENDENT_AMBULATORY_CARE_PROVIDER_SITE_OTHER): Payer: No Typology Code available for payment source | Admitting: Neurology

## 2012-09-19 VITALS — BP 133/87 | HR 65 | Ht 72.0 in | Wt 180.0 lb

## 2012-09-19 DIAGNOSIS — G4701 Insomnia due to medical condition: Secondary | ICD-10-CM

## 2012-09-19 DIAGNOSIS — G4710 Hypersomnia, unspecified: Secondary | ICD-10-CM

## 2012-09-19 DIAGNOSIS — G4713 Recurrent hypersomnia: Secondary | ICD-10-CM

## 2012-09-19 DIAGNOSIS — R0683 Snoring: Secondary | ICD-10-CM

## 2012-10-07 ENCOUNTER — Telehealth: Payer: Self-pay | Admitting: *Deleted

## 2012-10-07 NOTE — Telephone Encounter (Signed)
Called patient to discuss sleep study results from 09/19/2012.  Discussed findings, recommendations and follow up care.  Patient understood well and all questions were answered.  Pt is scheduled to follow up with Dr. Vickey Huger on 10/16/2012 and he is aware of that appt.  A referral has been sent to Dr. Haroldine Laws (ENT physician) and pt states they are waiting for a copy of the sleep study report in order to schedule him.  I faxed a report to their office tonight.  A copy was also sent to Dr. Nicholos Johns.  Pt is aware he will receive a copy of this report as well.  Pt explains he is concerned he didn't sleep on his back during this study and he states that while he tries not to sleep on his back, he always ends up there and he is aware that he stops breathing when on his back.  This also causes his uvula to swell due to irritation, which contributes to further airway obstruction.  Tech noted that pt stated he never sleeps on his back during his study.  Pt will be sure to discuss his concerns that he does turn to his back during sleep with his appt with Dr. Vickey Huger.  Pt has tonsils and would be happy to have them removed to make airway larger.

## 2012-10-08 NOTE — Progress Notes (Signed)
See media tab for full report  

## 2012-10-16 ENCOUNTER — Ambulatory Visit: Payer: No Typology Code available for payment source | Admitting: Neurology

## 2013-04-02 ENCOUNTER — Other Ambulatory Visit: Payer: Self-pay | Admitting: Orthopaedic Surgery

## 2013-04-02 DIAGNOSIS — M542 Cervicalgia: Secondary | ICD-10-CM

## 2013-04-18 ENCOUNTER — Other Ambulatory Visit: Payer: 59

## 2013-04-18 ENCOUNTER — Inpatient Hospital Stay
Admission: RE | Admit: 2013-04-18 | Discharge: 2013-04-18 | Disposition: A | Discharge status: Home / Self Care | Payer: 59 | Source: Ambulatory Visit | Attending: Orthopaedic Surgery | Admitting: Orthopaedic Surgery

## 2013-04-18 NOTE — Discharge Instructions (Signed)
Myelogram Discharge Instructions  1. Go home and rest quietly for the next 24 hours.  It is important to lie flat for the next 24 hours.  Get up only to go to the restroom.  You may lie in the bed or on a couch on your back, your stomach, your left side or your right side.  You may have one pillow under your head.  You may have pillows between your knees while you are on your side or under your knees while you are on your back.  2. DO NOT drive today.  Recline the seat as far back as it will go, while still wearing your seat belt, on the way home.  3. You may get up to go to the bathroom as needed.  You may sit up for 10 minutes to eat.  You may resume your normal diet and medications unless otherwise indicated.  Drink plenty of extra fluids today and tomorrow.  4. The incidence of a spinal headache with nausea and/or vomiting is about 5% (one in 20 patients).  If you develop a headache, lie flat and drink plenty of fluids until the headache goes away.  Caffeinated beverages may be helpful.  If you develop severe nausea and vomiting or a headache that does not go away with flat bed rest, call [phone].  5. You may resume normal activities after your 24 hours of bed rest is over; however, do not exert yourself strongly or do any heavy lifting tomorrow.  6. Call your physician for a follow-up appointment.   You may resume Strattera on Friday, April 19, 2013 after 1:00p.m.

## 2013-04-25 ENCOUNTER — Ambulatory Visit
Admission: RE | Admit: 2013-04-25 | Discharge: 2013-04-25 | Disposition: A | Discharge status: Home / Self Care | Payer: 59 | Source: Ambulatory Visit | Attending: Orthopaedic Surgery | Admitting: Orthopaedic Surgery

## 2013-04-25 DIAGNOSIS — M542 Cervicalgia: Secondary | ICD-10-CM

## 2013-04-25 MED ORDER — IOHEXOL 300 MG/ML  SOLN
10.0000 mL | Freq: Once | INTRAMUSCULAR | Status: AC | PRN
  Administered 2013-04-25: 10 mL via INTRATHECAL

## 2013-04-25 NOTE — Discharge Instructions (Signed)
Myelogram Discharge Instructions  1. Go home and rest quietly for the next 24 hours.  It is important to lie flat for the next 24 hours.  Get up only to go to the restroom.  You may lie in the bed or on a couch on your back, your stomach, your left side or your right side.  You may have one pillow under your head.  You may have pillows between your knees while you are on your side or under your knees while you are on your back.  2. DO NOT drive today.  Recline the seat as far back as it will go, while still wearing your seat belt, on the way home.  3. You may get up to go to the bathroom as needed.  You may sit up for 10 minutes to eat.  You may resume your normal diet and medications unless otherwise indicated.  Drink plenty of extra fluids today and tomorrow.  4. The incidence of a spinal headache with nausea and/or vomiting is about 5% (one in 20 patients).  If you develop a headache, lie flat and drink plenty of fluids until the headache goes away.  Caffeinated beverages may be helpful.  If you develop severe nausea and vomiting or a headache that does not go away with flat bed rest, call [phone].  5. You may resume normal activities after your 24 hours of bed rest is over; however, do not exert yourself strongly or do any heavy lifting tomorrow.  6. Call your physician for a follow-up appointment.   You may resume Strattera on Friday, April 26, 2013 after 9:30a.m.

## 2013-04-25 NOTE — Progress Notes (Signed)
Patient does not care for any valium prior to procedure.  jkl

## 2013-04-25 NOTE — Progress Notes (Signed)
Patient states he has been off Strattera for the past two days.  Dr. Carlota Raspberry in to speak with patient and his wife about procedure and to obtain consent.  jkl

## 2013-05-15 ENCOUNTER — Other Ambulatory Visit: Payer: Self-pay | Admitting: Otolaryngology

## 2013-05-19 ENCOUNTER — Emergency Department (HOSPITAL_COMMUNITY)
Admission: EM | Admit: 2013-05-19 | Discharge: 2013-05-19 | Disposition: A | Discharge status: Home / Self Care | Payer: 59 | Source: Home / Self Care | Attending: Emergency Medicine | Admitting: Emergency Medicine

## 2013-05-19 ENCOUNTER — Encounter (HOSPITAL_COMMUNITY): Payer: Self-pay | Admitting: Emergency Medicine

## 2013-05-19 DIAGNOSIS — J029 Acute pharyngitis, unspecified: Secondary | ICD-10-CM

## 2013-05-19 LAB — POCT RAPID STREP A: Streptococcus, Group A Screen (Direct): NEGATIVE

## 2013-05-19 LAB — GRAM STAIN

## 2013-05-19 MED ORDER — CLINDAMYCIN HCL 300 MG PO CAPS: 300.0000 mg | ORAL_CAPSULE | Freq: Four times a day (QID) | ORAL | Status: DC

## 2013-05-19 MED ORDER — BENZOCAINE (TOPICAL) 20 % EX AERO: 1.0000 "application " | INHALATION_SPRAY | Freq: Four times a day (QID) | CUTANEOUS | Status: DC | PRN

## 2013-05-19 MED ORDER — OXYCODONE-ACETAMINOPHEN 7.5-325 MG PO TABS: 1.0000 | ORAL_TABLET | ORAL | Status: DC | PRN

## 2013-05-19 NOTE — ED Provider Notes (Signed)
CSN: [id number]     Arrival date & time 05/19/13  1812 History   First MD Initiated Contact with Patient 05/19/13 1931     Chief Complaint  Patient presents with  . Sore Throat   (Consider location/radiation/quality/duration/timing/severity/associated sxs/prior Treatment) HPI Comments: 53 year old male on Humira for rheumatoid arthritis, with recent laser throat surgery to remove excess tissue, presents for evaluation of sore throat. He has had a sore throat since the procedure but it has been getting worse. Now, he has started to have increasing redness in his throat and pain and swelling in his lymph nodes. He is currently on Ceftin for infection prophylaxis since the surgery. He has not had any fevers or chills, NVD, cough.  Patient is a 53 y.o. male presenting with pharyngitis.  Sore Throat Pertinent negatives include no chest pain, no abdominal pain and no shortness of breath.    Past Medical History  Diagnosis Date  . Rheumatoid arthritis(714.0)   . Attention deficit disorder   . Memory loss   . Cervical spondylosis   . Dysuria     Hx. of Dysuria  . Chronic prostatitis     Possible chronic prostatitis  . Memory loss of unknown cause     dr [name] 2014 . NP Darrol Angel    Past Surgical History  Procedure Laterality Date  . Cervical fusion     Family History  Problem Relation Age of Onset  . Cancer Mother    History  Substance Use Topics  . Smoking status: Former Smoker    Types: Pipe  . Smokeless tobacco: Not on file     Comment: Quit in April  . Alcohol Use: No    Review of Systems  Constitutional: Negative for fever, chills and fatigue.  HENT: Positive for sore throat.   Eyes: Negative for visual disturbance.  Respiratory: Negative for cough and shortness of breath.   Cardiovascular: Negative for chest pain, palpitations and leg swelling.  Gastrointestinal: Negative for nausea, vomiting, abdominal pain, diarrhea and constipation.  Genitourinary: Negative  for dysuria, urgency, frequency and hematuria.  Musculoskeletal: Negative for arthralgias, myalgias, neck pain and neck stiffness.  Skin: Negative for rash.  Neurological: Negative for dizziness, weakness and light-headedness.  Hematological: Positive for adenopathy.    Allergies  Review of patient's allergies indicates no known allergies.  Home Medications   Current Outpatient Rx  Name  Route  Sig  Dispense  Refill  . baclofen (LIORESAL) 10 MG tablet   Oral   Take 5 mg by mouth 3 (three) times daily.         . benzocaine (HURRICAINE) 20 % oral spray   Mouth/Throat   Use as directed 1 application in the mouth or throat 4 (four) times daily as needed for throat irritation / pain.   57 mL   0   . celecoxib (CELEBREX) 200 MG capsule   Oral   Take 200 mg by mouth daily.         . clindamycin (CLEOCIN) 300 MG capsule   Oral   Take 1 capsule (300 mg total) by mouth 4 (four) times daily.   40 capsule   0   . HUMIRA PEN 40 MG/0.8ML injection   Subcutaneous   Inject 40 mg into the skin every 14 (fourteen) days.          Marland Kitchen oxyCODONE (ROXICODONE) 15 MG immediate release tablet   Oral   Take 15 mg by mouth 2 (two) times daily as needed for pain.         Marland Kitchen  oxyCODONE-acetaminophen (PERCOCET) 7.5-325 MG per tablet   Oral   Take 1 tablet by mouth every 4 (four) hours as needed for pain.   20 tablet   0   . predniSONE (DELTASONE) 5 MG tablet   Oral   Take 5 mg by mouth daily.         . pregabalin (LYRICA) 150 MG capsule   Oral   Take 150 mg by mouth daily.          . tadalafil (CIALIS) 10 MG tablet   Oral   Take 10 mg by mouth daily as needed for erectile dysfunction.         . VOLTAREN 1 % GEL   Topical   Apply 2 g topically 2 (two) times daily as needed.           BP 124/84  Pulse 89  Temp(Src) 97.9 F (36.6 C) (Oral)  Resp 18  SpO2 100% Physical Exam  Nursing note and vitals reviewed. Constitutional: He is oriented to person, place, and time.  He appears well-developed and well-nourished. No distress.  HENT:  Head: Normocephalic and atraumatic.  Mouth/Throat: Oropharyngeal exudate and posterior oropharyngeal erythema present.  Adherent white plaques versus scar tissue in the nasal mucosa and all over the posterior oropharynx. It does not bleed when scraped  Cardiovascular: Normal rate, regular rhythm and normal heart sounds.  Exam reveals no gallop and no friction rub.   No murmur heard. Pulmonary/Chest: Effort normal and breath sounds normal. No respiratory distress. He has no wheezes. He has no rales.  Lymphadenopathy:    He has cervical adenopathy (tonsillar).  Neurological: He is alert and oriented to person, place, and time. Coordination normal.  Skin: Skin is warm and dry. No rash noted. He is not diaphoretic.  Psychiatric: He has a normal mood and affect. Judgment normal.    ED Course  Procedures (including critical care time) Labs Review Labs Reviewed  GRAM STAIN  CULTURE, GROUP A STREP  POCT RAPID STREP A ([facility] CARE ONLY)   Imaging Review No results found.    MDM   1. Pharyngitis    However unlikely, the appearance of his throat is consistent with the grey plaque of diphtheria, this appearance may be complicated by the scar tissue that is present. This may be possible given that he is immunocompromised on Humira, his vaccinations are UTD.  A Gram stain was sent to decide if we need to initiate treatment for diphtheria, however it was negative for any organisms. The test for diphtheria toxin was also sent. In the meantime, we will switch his Ceftin to clindamycin. He will start this tonight, and he will follow up with his otolaryngologist tomorrow morning.  ED if worsening   Meds ordered this encounter  Medications  . clindamycin (CLEOCIN) 300 MG capsule    Sig: Take 1 capsule (300 mg total) by mouth 4 (four) times daily.    Dispense:  40 capsule    Refill:  0    Order Specific Question:  Supervising  Provider    Answer:  Clementeen Graham, S [id number]  . oxyCODONE-acetaminophen (PERCOCET) 7.5-325 MG per tablet    Sig: Take 1 tablet by mouth every 4 (four) hours as needed for pain.    Dispense:  20 tablet    Refill:  0    Order Specific Question:  Supervising Provider    Answer:  Clementeen Graham, S [id number]  . benzocaine (HURRICAINE) 20 % oral spray    Sig:  Use as directed 1 application in the mouth or throat 4 (four) times daily as needed for throat irritation / pain.    Dispense:  57 mL    Refill:  0    Order Specific Question:  Supervising Provider    Answer:  Clementeen Graham, S [[id number]]     Graylon Good, PA-C 05/19/13 2057  Graylon Good, PA-C 05/19/13 2059

## 2013-05-19 NOTE — ED Notes (Signed)
C/o throat pain  States he did have surgery three days ago  Feels as if he has an infection

## 2013-05-19 NOTE — ED Notes (Signed)
Culture sent down for diptheria toxin and culture

## 2013-05-19 NOTE — Discharge Instructions (Signed)
Followup with your otolaryngologist tomorrow for a recheck.  Pharyngitis Pharyngitis is redness, pain, and swelling (inflammation) of your pharynx.  CAUSES  Pharyngitis is usually caused by infection. Most of the time, these infections are from viruses (viral) and are part of a cold. However, sometimes pharyngitis is caused by bacteria (bacterial). Pharyngitis can also be caused by allergies. Viral pharyngitis may be spread from person to person by coughing, sneezing, and personal items or utensils (cups, forks, spoons, toothbrushes). Bacterial pharyngitis may be spread from person to person by more intimate contact, such as kissing.  SIGNS AND SYMPTOMS  Symptoms of pharyngitis include:   Sore throat.   Tiredness (fatigue).   Low-grade fever.   Headache.  Joint pain and muscle aches.  Skin rashes.  Swollen lymph nodes.  Plaque-like film on throat or tonsils (often seen with bacterial pharyngitis). DIAGNOSIS  Your health care provider will ask you questions about your illness and your symptoms. Your medical history, along with a physical exam, is often all that is needed to diagnose pharyngitis. Sometimes, a rapid strep test is done. Other lab tests may also be done, depending on the suspected cause.  TREATMENT  Viral pharyngitis will usually get better in 3 4 days without the use of medicine. Bacterial pharyngitis is treated with medicines that kill germs (antibiotics).  HOME CARE INSTRUCTIONS   Drink enough water and fluids to keep your urine clear or pale yellow.   Only take over-the-counter or prescription medicines as directed by your health care provider:   If you are prescribed antibiotics, make sure you finish them even if you start to feel better.   Do not take aspirin.   Get lots of rest.   Gargle with 8 oz of salt water ( tsp of salt per 1 qt of water) as often as every 1 2 hours to soothe your throat.   Throat lozenges (if you are not at risk for  choking) or sprays may be used to soothe your throat. SEEK MEDICAL CARE IF:   You have large, tender lumps in your neck.  You have a rash.  You cough up green, yellow-brown, or bloody spit. SEEK IMMEDIATE MEDICAL CARE IF:   Your neck becomes stiff.  You drool or are unable to swallow liquids.  You vomit or are unable to keep medicines or liquids down.  You have severe pain that does not go away with the use of recommended medicines.  You have trouble breathing (not caused by a stuffy nose). MAKE SURE YOU:   Understand these instructions.  Will watch your condition.  Will get help right away if you are not doing well or get worse. Document Released: 03/28/2005 Document Revised: 01/16/2013 Document Reviewed: 12/03/2012 [facility] Patient Information 2014 [place], [place].

## 2013-05-20 ENCOUNTER — Telehealth (HOSPITAL_COMMUNITY): Payer: Self-pay | Admitting: *Deleted

## 2013-05-20 NOTE — ED Provider Notes (Signed)
Medical screening examination/treatment/procedure(s) were performed by non-physician practitioner and as supervising physician I was immediately available for consultation/collaboration.  Leslee Home, M.D.  Reuben Likes, MD 05/20/13 1034

## 2013-05-20 NOTE — ED Notes (Addendum)
Pt. called and asked for his lab results. Pt. verified x 2 and given results (culture pending, gram stain- no organisms seen).  Pt. is concerned that is Diptheria. Pt. reassured that if the lab had seen something like that, they would have called [place].  He said the growth in his throat is spreading and it looks like pictures he has seen of it.  Pt. is taking his Clindamycin. Pt. wants to talk to [facility]. I took his number and told him that I would ask him to call. Vassie Moselle 05/20/2013 Throat culture: no beta hemolytic strep isolated.  Almedia Balls PA notified.  Ian Malkin said he had reassured pt. and talked to his ENT yesterday. 05/21/2013

## 2013-05-21 LAB — CULTURE, GROUP A STREP

## 2013-06-27 ENCOUNTER — Other Ambulatory Visit: Payer: Self-pay | Admitting: Family Medicine

## 2013-06-27 ENCOUNTER — Ambulatory Visit
Admission: RE | Admit: 2013-06-27 | Discharge: 2013-06-27 | Disposition: A | Discharge status: Home / Self Care | Payer: 59 | Source: Ambulatory Visit | Attending: Family Medicine | Admitting: Family Medicine

## 2013-06-27 DIAGNOSIS — R079 Chest pain, unspecified: Secondary | ICD-10-CM

## 2013-07-30 ENCOUNTER — Other Ambulatory Visit: Payer: Self-pay | Admitting: Orthopaedic Surgery

## 2013-07-30 DIAGNOSIS — M542 Cervicalgia: Secondary | ICD-10-CM

## 2013-07-30 DIAGNOSIS — M546 Pain in thoracic spine: Secondary | ICD-10-CM

## 2013-08-05 ENCOUNTER — Ambulatory Visit
Admission: RE | Admit: 2013-08-05 | Discharge: 2013-08-05 | Disposition: A | Discharge status: Home / Self Care | Payer: 59 | Source: Ambulatory Visit | Attending: Orthopaedic Surgery | Admitting: Orthopaedic Surgery

## 2013-08-05 ENCOUNTER — Other Ambulatory Visit: Payer: 59

## 2013-08-05 DIAGNOSIS — M546 Pain in thoracic spine: Secondary | ICD-10-CM

## 2013-08-05 DIAGNOSIS — M542 Cervicalgia: Secondary | ICD-10-CM

## 2013-09-09 ENCOUNTER — Encounter (HOSPITAL_COMMUNITY): Payer: Self-pay

## 2013-09-19 ENCOUNTER — Ambulatory Visit: Payer: 59 | Admitting: Cardiovascular Disease

## 2013-09-23 ENCOUNTER — Ambulatory Visit (INDEPENDENT_AMBULATORY_CARE_PROVIDER_SITE_OTHER): Payer: 59 | Admitting: Internal Medicine

## 2013-09-23 ENCOUNTER — Encounter: Payer: Self-pay | Admitting: Internal Medicine

## 2013-09-23 VITALS — BP 144/100 | HR 61 | Ht 72.0 in | Wt 198.0 lb

## 2013-09-23 DIAGNOSIS — R079 Chest pain, unspecified: Secondary | ICD-10-CM

## 2013-09-23 NOTE — Patient Instructions (Signed)
Your physician recommends that you schedule a follow-up appointment in:  To be determined after tests    Your physician recommends that you continue on your current medications as directed. Please refer to the Current Medication list given to you today.    Your physician has requested that you have a cardiac MRI. Cardiac MRI uses a computer to create images of your heart as its beating, producing both still and moving pictures of your heart and major blood vessels. For further information please visit [email or web address]. Please follow the instruction sheet given to you today for more information.    Your physician has requested that you have en exercise stress myoview. For further information please visit [email or web address] Please follow instruction sheet, as given.    Thank you for choosing [facility] !

## 2013-09-23 NOTE — Progress Notes (Signed)
HPI Patient is a 53 yo who presents for evaluation of chest pressure, fatigue Patient has no known history of CAD  He has extensive history of back problems (cervical) with multiple surgeries and RA Surgery to neck about 100 days ago.  He was slowly recuperating.  Endurance growing.  He was walking more, without pain/SOB Two weeks ago he was tasered by police x2  To chest /epigastric area. Since then he has had chest tightness and SOB worse with exertion.  If he walks quickly to car, for example, he will break out into a chest, get SOB, have tightness. Symptoms relieved with rest.   Some mild dizziness  No syncope. Doesn't feel right.    No Known Allergies  Current Outpatient Prescriptions  Medication Sig Dispense Refill  . baclofen (LIORESAL) 10 MG tablet Take 5 mg by mouth 3 (three) times daily.      . celecoxib (CELEBREX) 200 MG capsule Take 200 mg by mouth daily.      Marland Kitchen HUMIRA PEN 40 MG/0.8ML injection Inject 40 mg into the skin every 14 (fourteen) days.       Marland Kitchen oxyCODONE (ROXICODONE) 15 MG immediate release tablet Take 15 mg by mouth 2 (two) times daily as needed for pain.      . predniSONE (DELTASONE) 5 MG tablet Take 2 mg by mouth daily.       . pregabalin (LYRICA) 150 MG capsule Take 150 mg by mouth daily.       . VOLTAREN 1 % GEL Apply 2 g topically 2 (two) times daily as needed.        No current facility-administered medications for this visit.    Past Medical History  Diagnosis Date  . Rheumatoid arthritis(714.0)   . Attention deficit disorder   . Memory loss   . Cervical spondylosis   . Dysuria     Hx. of Dysuria  . Chronic prostatitis     Possible chronic prostatitis  . Memory loss of unknown cause     dr [name] 2014 . NP Darrol Angel     Past Surgical History  Procedure Laterality Date  . Cervical fusion    Multple cervical surgeries    Family History  Problem Relation Age of Onset  . Cancer Mother   Brother, father with CAD  History   Social  History  . Marital Status: Married    Spouse Name: N/A    Number of Children: 0  . Years of Education: assoc   Occupational History  . [employer]    Social History Main Topics  . Smoking status: Former Smoker    Types: Pipe  . Smokeless tobacco: Not on file     Comment: Quit in April  . Alcohol Use: No  . Drug Use: No  . Sexual Activity: Not on file   Other Topics Concern  . Not on file   Social History Narrative  . No narrative on file    Review of Systems:  All systems reviewed.  They are negative to the above problem except as previously stated.  Vital Signs: BP 144/100  Pulse 61  Ht 6' (1.829 m)  Wt 198 lb (89.812 kg)  BMI 26.85 kg/m2  Physical Exam Patient is in NAD HEENT:  Normocephalic, atraumatic. EOMI, PERRLA.  Neck: JVP is normal.  No bruits. NEck movement restricted Lungs: clear to auscultation. No rales no wheezes.  Chest  Nontender Heart: Regular rate and rhythm. Normal S1, S2. No S3.   No significant  murmurs or rub  PMI not displaced.  Abdomen:  Supple, nontender. Normal bowel sounds. No masses. No hepatomegaly.  Extremities:   Good distal pulses throughout. No lower extremity edema.  Musculoskeletal :moving all extremities. R arm appears weaker    Neuro:   alert and oriented x3.  CN II-XII grossly intact.  EKG  SR 74  Assessment and Plan:  Patinet is a 53 yo with no known CAD  Does have signif family history of CAD PResents with 2 wks of SOB and chest tightness  Exacerbated by activity   He had been tasered by police prior to this starting. I have performed a limited, quick look echo to r/o effusion.  This was negative LV and RV were normal in size and had normal funciton  PA pressure did not appear elevated.  IVC was small. No documented data on taser injury in literature beyond arrhythmia. QUestion if symptoms are unrelated and represent CAD  Would recomm stress myoview to evaluate exercise tolerance, ischemia.   Would also set up for  MRI to evaluate for any tissue injury that would be missed on echo or nuclear test. Take ASA  Take activities as tolerated.  Need to get lipids

## 2013-09-27 ENCOUNTER — Encounter (HOSPITAL_COMMUNITY)
Admission: RE | Admit: 2013-09-27 | Discharge: 2013-09-27 | Disposition: A | Discharge status: Home / Self Care | Payer: 59 | Source: Ambulatory Visit | Attending: Internal Medicine | Admitting: Internal Medicine

## 2013-09-27 ENCOUNTER — Encounter (HOSPITAL_COMMUNITY): Payer: Self-pay

## 2013-09-27 ENCOUNTER — Ambulatory Visit (HOSPITAL_COMMUNITY)
Admission: RE | Admit: 2013-09-27 | Discharge: 2013-09-27 | Disposition: A | Discharge status: Home / Self Care | Payer: 59 | Source: Ambulatory Visit | Attending: Internal Medicine | Admitting: Internal Medicine

## 2013-09-27 DIAGNOSIS — R079 Chest pain, unspecified: Secondary | ICD-10-CM

## 2013-09-27 MED ORDER — REGADENOSON 0.4 MG/5ML IV SOLN: 0.4000 mg | Freq: Once | INTRAVENOUS | Status: AC | PRN

## 2013-09-27 MED ORDER — SODIUM CHLORIDE 0.9 % IJ SOLN
10.0000 mL | INTRAMUSCULAR | Status: DC | PRN
  Administered 2013-09-27: 10 mL via INTRAVENOUS

## 2013-09-27 MED ORDER — TECHNETIUM TC 99M SESTAMIBI GENERIC - CARDIOLITE
10.0000 | Freq: Once | INTRAVENOUS | Status: AC | PRN
  Administered 2013-09-27: 10 via INTRAVENOUS

## 2013-09-27 MED ORDER — SODIUM CHLORIDE 0.9 % IJ SOLN
INTRAMUSCULAR | Status: AC
  Administered 2013-09-27: 10 mL via INTRAVENOUS
  Filled 2013-09-27: qty 10

## 2013-09-27 MED ORDER — REGADENOSON 0.4 MG/5ML IV SOLN
INTRAVENOUS | Status: AC
  Filled 2013-09-27: qty 5

## 2013-09-27 MED ORDER — TECHNETIUM TC 99M SESTAMIBI - CARDIOLITE
30.0000 | Freq: Once | INTRAVENOUS | Status: AC | PRN
  Administered 2013-09-27: 10:00:00 30 via INTRAVENOUS

## 2013-09-27 NOTE — Progress Notes (Signed)
Stress Lab Nurses Notes - Jeani Hawking  DJ SENTENO 09/27/2013 Reason for doing test: Chest Pain Type of test: Stress Cardiolite Nurse performing test: Parke Poisson, RN Nuclear Medicine Tech: Lou Cal Echo Tech: Not Applicable MD performing test: Nilda Riggs MD: [facility] Test explained and consent signed: yes IV started: 22g jelco, Saline lock flushed, No redness or edema and Saline lock started in radiology Symptoms: SOB Treatment/Intervention: None Reason test stopped: fatigue After recovery IV was: Discontinued via X-ray tech and No redness or edema Patient to return to Nuc. Med at : 10:45 Patient discharged: Home Patient's Condition upon discharge was: stable Comments: During test peak BP 219/102 & HR 151.  Recovery BP 203/96 & HR 93.  Symptoms resolved in recovery. Erskine Speed T

## 2013-10-02 ENCOUNTER — Encounter: Payer: Self-pay | Admitting: Internal Medicine

## 2013-10-16 ENCOUNTER — Ambulatory Visit (HOSPITAL_COMMUNITY): Admission: RE | Admit: 2013-10-16 | Payer: 59 | Source: Ambulatory Visit

## 2013-10-24 ENCOUNTER — Ambulatory Visit (INDEPENDENT_AMBULATORY_CARE_PROVIDER_SITE_OTHER): Payer: 59 | Admitting: Neurology

## 2013-10-24 ENCOUNTER — Encounter: Payer: Self-pay | Admitting: Neurology

## 2013-10-24 ENCOUNTER — Encounter (INDEPENDENT_AMBULATORY_CARE_PROVIDER_SITE_OTHER): Payer: Self-pay

## 2013-10-24 VITALS — BP 143/99 | HR 83 | Wt 200.0 lb

## 2013-10-24 DIAGNOSIS — R413 Other amnesia: Secondary | ICD-10-CM

## 2013-10-24 DIAGNOSIS — R4789 Other speech disturbances: Secondary | ICD-10-CM

## 2013-10-24 DIAGNOSIS — R479 Unspecified speech disturbances: Secondary | ICD-10-CM

## 2013-10-24 NOTE — Progress Notes (Signed)
Reason for visit: Speech disorder, memory problems  Charles Stevens is an 53 y.o. male  History of present illness:  Charles Stevens is a 53 year old right-handed white male with a history of ADD. He reports some problems with memory, and onset of a stuttering speech problem. He reports difficulty with word finding. The patient was last seen through this office in January 2014. The patient at that time was set up for blood work which was unremarkable, and he was to have MRI evaluation of the brain and an EEG study which was never done. The patient was set up for a sleep evaluation, and a sleep study did not show evidence of sleep apnea. The patient underwent nasal surgery and uvulectomy for upper airway blockage in March of 2015, and he had a fourth cervical spine surgery as well in the spring of 2015. The patient indicates that he got married in January 2015.  He describes an unusual event 2 months ago where his wife was concerned that he was going to commit suicide. She called for help, and the police came in and tazered him in the chest. The patient has had some ongoing issues with confusion, memory, stuttering speech problems. The patient reports that he will have episodes where his right hand is not functioning properly. The patient returns for an evaluation.    Past Medical History  Diagnosis Date  . Rheumatoid arthritis(714.0)   . Attention deficit disorder   . Memory loss   . Cervical spondylosis   . Dysuria     Hx. of Dysuria  . Chronic prostatitis     Possible chronic prostatitis  . Memory loss of unknown cause     dr [name] 2014 . NP Darrol Angel     Past Surgical History  Procedure Laterality Date  . Cervical fusion    . Nose surgery      with uvulectomy    Family History  Problem Relation Age of Onset  . Cancer Mother     Social history:  reports that he has quit smoking. His smoking use included Pipe. He has never used smokeless tobacco. He reports that he does not  drink alcohol or use illicit drugs.   No Known Allergies  Medications:  Current Outpatient Prescriptions on File Prior to Visit  Medication Sig Dispense Refill  . baclofen (LIORESAL) 10 MG tablet Take 5 mg by mouth 3 (three) times daily.      . celecoxib (CELEBREX) 200 MG capsule Take 200 mg by mouth daily.      Marland Kitchen HUMIRA PEN 40 MG/0.8ML injection Inject 40 mg into the skin every 14 (fourteen) days.       Marland Kitchen oxyCODONE (ROXICODONE) 15 MG immediate release tablet Take 15 mg by mouth 2 (two) times daily as needed for pain.      . pregabalin (LYRICA) 150 MG capsule Take 150 mg by mouth 3 (three) times daily.       . VOLTAREN 1 % GEL Apply 2 g topically 2 (two) times daily as needed.        No current facility-administered medications on file prior to visit.    ROS:  Out of a complete 14 system review of symptoms, the patient complains only of the following symptoms, and all other reviewed systems are negative.  Excessive sweating  Facial swelling Eye discharge, eye itching, eye redness, loss of vision, blurred vision Frequent infections Urgency of the bladder Joint pain, joint swelling, walking difficulties Memory loss, dizziness, headache,  numbness, speech difficulties, weakness, tremors Decreased concentration  Blood pressure 143/99, pulse 83, weight 200 lb (90.719 kg).  Physical Exam  General: The patient is alert and cooperative at the time of the examination.  Skin: No significant peripheral edema is noted.   Neurologic Exam  Mental status: The patient is oriented x 3. Mini-Mental status examination done today shows a total score of 30/30.   Cranial nerves: Facial symmetry is present. Speech is associated with a stuttering quality, no obvious aphasia is seen.Marland Kitchen Extraocular movements are full. Visual fields are full.  Motor: The patient has good strength in all 4 extremities. The patient does have some giveaway weakness of the right arm, no true weakness is seen.   Sensory  examination: Soft touch sensation is symmetric on the face, arms, and legs.   Coordination: The patient has good finger-nose-finger and heel-to-shin bilaterally.  Gait and station: The patient has a normal gait. Tandem gait is normal. Romberg is negative. No drift is seen.  Reflexes: Deep tendon reflexes are symmetric.   MRI brain 05/24/2011:  IMPRESSION:  No acute or focal intracranial abnormality. No evidence for  multiple sclerosis or other white matter disease.    Assessment/Plan:   1. Speech alteration, stuttering speech  2. Reported memory disturbance  3. ADD  The patient has a very unusual affect. The patient reports some difficulty is with memory, and onset of a stuttering type speech pattern. The patient will be set up again for MRI evaluation of the brain, and an EEG study. If the studies are unremarkable, he will have a referral for formal neuropsychological evaluation. He will followup in 4 or 5 months. This patient likely has underlying significant psychiatric disease.   Marlan Palau MD 10/24/2013 1:16 PM  [facility] [address] [place], [place] [address]  Phone [phone] Fax [phone]

## 2013-11-01 ENCOUNTER — Encounter: Payer: Self-pay | Admitting: Internal Medicine

## 2013-11-07 ENCOUNTER — Other Ambulatory Visit: Payer: 59

## 2013-11-11 ENCOUNTER — Encounter: Payer: Self-pay | Admitting: Internal Medicine

## 2013-11-21 ENCOUNTER — Ambulatory Visit (HOSPITAL_COMMUNITY): Payer: 59

## 2013-11-28 ENCOUNTER — Ambulatory Visit (INDEPENDENT_AMBULATORY_CARE_PROVIDER_SITE_OTHER): Payer: 59

## 2013-11-28 ENCOUNTER — Telehealth: Payer: Self-pay | Admitting: Neurology

## 2013-11-28 DIAGNOSIS — R479 Unspecified speech disturbances: Secondary | ICD-10-CM

## 2013-11-28 DIAGNOSIS — R413 Other amnesia: Secondary | ICD-10-CM

## 2013-11-28 NOTE — Telephone Encounter (Signed)
I called patient. The EEG study was unremarkable. I will call him when the MRI the brain evaluation results are available, and the patient will have formal neuropsychological evaluation.

## 2013-11-28 NOTE — Procedures (Signed)
    History:  Charles Stevens is a 53 year old gentleman with a history of stuttering speech and reported memory problems over the last 18 months. The patient is being evaluated for these issues.   This is a routine EEG. No skull defects are noted. Medications include baclofen, Celebrex, insulin, oxycodone, Lyrica, Strattera, and Voltaren.   EEG classification: Normal awake  Description of the recording: The background rhythms of this recording consists of a fairly well modulated medium amplitude alpha rhythm of 8 Hz that is reactive to eye opening and closure. As the record progresses, the patient appears to remain in the waking state throughout the recording. Photic stimulation was performed, resulting in a bilateral and symmetric photic driving response. Hyperventilation was also performed, resulting in a minimal buildup of the background rhythm activities without significant slowing seen. At no time during the recording does there appear to be evidence of spike or spike wave discharges or evidence of focal slowing. EKG monitor shows no evidence of cardiac rhythm abnormalities with a heart rate of 78.  Impression: This is a normal EEG recording in the waking state. No evidence of ictal or interictal discharges are seen.

## 2014-02-20 ENCOUNTER — Other Ambulatory Visit: Payer: 59

## 2014-02-27 ENCOUNTER — Ambulatory Visit: Payer: 59 | Admitting: Adult Health

## 2014-02-27 ENCOUNTER — Ambulatory Visit (INDEPENDENT_AMBULATORY_CARE_PROVIDER_SITE_OTHER): Payer: 59

## 2014-02-27 ENCOUNTER — Other Ambulatory Visit: Payer: Self-pay | Admitting: Neurology

## 2014-02-27 DIAGNOSIS — R479 Unspecified speech disturbances: Secondary | ICD-10-CM

## 2014-02-27 DIAGNOSIS — R413 Other amnesia: Secondary | ICD-10-CM

## 2014-02-28 MED ORDER — GADOPENTETATE DIMEGLUMINE 469.01 MG/ML IV SOLN: 10.0000 mL | Freq: Once | INTRAVENOUS | Status: AC | PRN

## 2014-03-01 ENCOUNTER — Telehealth: Payer: Self-pay | Admitting: Neurology

## 2014-03-01 NOTE — Telephone Encounter (Signed)
I called the patient. The MRI of the brain and the EEG are normal.

## 2014-03-19 ENCOUNTER — Other Ambulatory Visit: Payer: Self-pay | Admitting: Family Medicine

## 2014-03-19 DIAGNOSIS — N6001 Solitary cyst of right breast: Secondary | ICD-10-CM

## 2014-03-20 ENCOUNTER — Telehealth: Payer: Self-pay | Admitting: *Deleted

## 2014-03-20 DIAGNOSIS — R413 Other amnesia: Secondary | ICD-10-CM

## 2014-03-20 NOTE — Telephone Encounter (Signed)
Please find out if the patient has had a neuropsychological evaluation or if he has been contacted to set up an appointment. If not we will have to get a new referral for neuropsychological testing. Once we have these results we can determine if he needs to follow-up with [place].

## 2014-03-20 NOTE — Telephone Encounter (Signed)
Here it is

## 2014-03-20 NOTE — Telephone Encounter (Signed)
It appears by Dr. Clarisa Kindred note that he wanted the patient to have a neuropsychological evaluation if MRI of the brain and EEG was unremarkable. The patient has had these tests completed and they were in fact unremarkable. I will put in a referral for neuropsychological testing.

## 2014-04-01 ENCOUNTER — Ambulatory Visit
Admission: RE | Admit: 2014-04-01 | Discharge: 2014-04-01 | Disposition: A | Discharge status: Home / Self Care | Payer: 59 | Source: Ambulatory Visit | Attending: Family Medicine | Admitting: Family Medicine

## 2014-04-01 ENCOUNTER — Ambulatory Visit: Payer: 59 | Admitting: Adult Health

## 2014-04-01 DIAGNOSIS — N6001 Solitary cyst of right breast: Secondary | ICD-10-CM

## 2014-05-05 ENCOUNTER — Encounter: Payer: Self-pay | Admitting: Vascular Surgery

## 2014-05-07 ENCOUNTER — Encounter: Payer: Self-pay | Admitting: Vascular Surgery

## 2014-05-21 ENCOUNTER — Encounter: Payer: Self-pay | Admitting: Vascular Surgery

## 2014-05-22 ENCOUNTER — Encounter: Payer: Self-pay | Admitting: Vascular Surgery

## 2014-05-22 ENCOUNTER — Ambulatory Visit (INDEPENDENT_AMBULATORY_CARE_PROVIDER_SITE_OTHER): Payer: 59 | Admitting: Vascular Surgery

## 2014-05-22 VITALS — BP 154/99 | HR 111 | Resp 16 | Ht 71.0 in | Wt 215.0 lb

## 2014-05-22 DIAGNOSIS — I723 Aneurysm of iliac artery: Secondary | ICD-10-CM

## 2014-05-22 NOTE — Progress Notes (Signed)
Patient ID: Charles Stevens, male   DOB: 11-27-1960, 54 y.o.   MRN: [id number]  Reason for Consult: Left common iliac artery aneurysm.   Referred by Darrow Bussing, MD  Subjective:     HPI:  Charles Stevens is a 54 y.o. male Who was having problems with repeated episodes of C. Difficile. Ultimately this prompted a CT scan of the abdomen which was done in [place]. I have reviewed these films. He has a 2.5 cm left common iliac artery aneurysm and was referred for vascular consultation. Does have a history of chronic back pain and has had 4 previous operations on his back. He denies significant abdominal pain. There is no family history of aneurysmal disease. He has a remote history of tobacco use but quit approximately 30 years ago. He does state that his blood pressure has been somewhat difficult to control.  I have reviewed you to his records that were sent with him. He does have a history of rheumatoid arthritis and is on immunosuppressive medication. Also has a history of iron deficiency anemia.  EKG on 04/06/2014 showed a normal sinus rhythm. There appeared to be an inferior infarct of undetermined age.  Past Medical History  Diagnosis Date  . Rheumatoid arthritis(714.0)   . Attention deficit disorder   . Memory loss   . Cervical spondylosis   . Dysuria     Hx. of Dysuria  . Chronic prostatitis     Possible chronic prostatitis  . Memory loss of unknown cause     dr [name] 2014 . NP Darrol Angel    Family History  Problem Relation Age of Onset  . Cancer Mother   . Varicose Veins Mother   . Hyperlipidemia Father   . Hypertension Father   . Diabetes Brother   . Heart disease Brother   . Peripheral vascular disease Brother    Past Surgical History  Procedure Laterality Date  . Cervical fusion    . Nose surgery      with uvulectomy    Short Social History:  History  Substance Use Topics  . Smoking status: Former Smoker    Types: Pipe  . Smokeless tobacco: Never  Used     Comment: Quit in April  . Alcohol Use: No    No Known Allergies  Current Outpatient Prescriptions  Medication Sig Dispense Refill  . baclofen (LIORESAL) 10 MG tablet Take 10 mg by mouth 3 (three) times daily.     . celecoxib (CELEBREX) 200 MG capsule Take 200 mg by mouth daily.    . Omeprazole (PRILOSEC PO) Take by mouth daily.    Marland Kitchen oxyCODONE (ROXICODONE) 15 MG immediate release tablet Take 15 mg by mouth every 6 (six) hours as needed for pain.     . pregabalin (LYRICA) 150 MG capsule Take 150 mg by mouth 3 (three) times daily.     Marland Kitchen STRATTERA 100 MG capsule Take 1 capsule by mouth daily.    . vancomycin (VANCOCIN) 250 MG capsule Take 250 mg by mouth 4 (four) times daily.    . VOLTAREN 1 % GEL Apply 2 g topically 2 (two) times daily as needed.     Marland Kitchen HUMIRA PEN 40 MG/0.8ML injection Inject 40 mg into the skin every 14 (fourteen) days.     . PredniSONE 2 MG TBEC Take 2 mg by mouth daily.     No current facility-administered medications for this visit.    Review of Systems  Constitutional: Negative for chills  and fever.  Eyes: Negative for loss of vision.  Respiratory: Negative for cough and wheezing.  Cardiovascular: Positive for dyspnea with exertion, leg swelling and orthopnea. Negative for chest pain, chest tightness, claudication and palpitations.  GI: Positive for blood in stool. Negative for vomiting.  GU: Negative for dysuria and hematuria.  Musculoskeletal: Negative for leg pain, joint pain and myalgias.  Skin: Negative for rash and wound.  Neurological: Negative for dizziness and speech difficulty.  Hematologic: Negative for bruises/bleeds easily. Psychiatric: Negative for depressed mood.        Objective:  Objective  Filed Vitals:   05/22/14 1530  BP: 154/99  Pulse: 111  Resp: 16  Height: 5\' 11"  (1.803 m)  Weight: 215 lb (97.523 kg)   Body mass index is 30 kg/(m^2).  Physical Exam  Constitutional: He is oriented to person, place, and time. He  appears well-developed and well-nourished.  HENT:  Head: Normocephalic and atraumatic.  Neck: Neck supple. No JVD present. No thyromegaly present.  Cardiovascular: Normal rate, regular rhythm and normal heart sounds.  Exam reveals no friction rub.   No murmur heard. Pulses:      Femoral pulses are 2+ on the right side, and 2+ on the left side.      Popliteal pulses are 2+ on the right side, and 2+ on the left side.       Dorsalis pedis pulses are 2+ on the right side, and 2+ on the left side.       Posterior tibial pulses are 2+ on the right side, and 2+ on the left side.  He has no significant lower extremity swelling. I do not detect carotid bruits.  Pulmonary/Chest: Breath sounds normal. He has no wheezes. He has no rales.  Abdominal: Soft. Bowel sounds are normal. There is no tenderness.  Musculoskeletal: He exhibits no edema.  Lymphadenopathy:    He has no cervical adenopathy.  Neurological: He is alert and oriented to person, place, and time.  Skin: No rash noted.  Psychiatric: He has a normal mood and affect.    Data: I have reviewed the images from his CT scan which was done in [facility] He has a 2.5 cm left common iliac artery aneurysm. There is laminated thrombus present within the aneurysm. This extends up to the aortic bifurcation and also down to the bifurcation of the left common iliac artery.      Assessment/Plan:     LEFT COMMON ILIAC ARTERY ANEURYSM: His left common iliac artery aneurysm measures 2.5 cm in maximum diameter. I explained that we would normally consider elective repair at 3.5 cm. I have recommended a follow up duplex in 6 months and I'll see him back at that time. Although he does not smoke cigarettes, on the way out today he does tell me that he occasionally uses nicotine replacement and I encouraged him to try to get off that completely. He will also follow up with his primary care physician to be sure that his blood pressure is well-controlled. Today  his diastolic pressure was elevated at 99 mm Hg. I'll see him back in 6 months. He knows to call sooner if he has problems.     [facility] MD [facility]

## 2014-05-26 NOTE — Addendum Note (Signed)
Addended by: Adria Dill L on: 05/26/2014 01:50 PM   Modules accepted: Orders

## 2014-06-20 ENCOUNTER — Telehealth: Payer: Self-pay | Admitting: Neurology

## 2014-06-20 ENCOUNTER — Ambulatory Visit: Payer: 59 | Attending: Psychology | Admitting: Psychology

## 2014-06-20 DIAGNOSIS — R413 Other amnesia: Secondary | ICD-10-CM

## 2014-06-20 NOTE — Progress Notes (Signed)
Patient arrived for neuropsychological evaluation. He reported that he changed his insurance to [facility] just last week. Unfortunately I am not able to accept his insurance at this time. He cannot afford to self-pay. Patient understood situation. Note sent to referring provider.  Eula Flax, Ph.D

## 2014-06-20 NOTE — Telephone Encounter (Signed)
Due to insurance reasons, the patient could not have nerve psychological evaluation with Dr. Leonides Cave, I will refer to [facility].

## 2014-07-24 ENCOUNTER — Other Ambulatory Visit: Payer: Self-pay | Admitting: Gastroenterology

## 2014-07-24 DIAGNOSIS — R1084 Generalized abdominal pain: Secondary | ICD-10-CM

## 2014-07-29 ENCOUNTER — Ambulatory Visit
Admission: RE | Admit: 2014-07-29 | Discharge: 2014-07-29 | Disposition: A | Discharge status: Home / Self Care | Payer: 59 | Source: Ambulatory Visit | Attending: Gastroenterology | Admitting: Gastroenterology

## 2014-07-29 DIAGNOSIS — R1084 Generalized abdominal pain: Secondary | ICD-10-CM

## 2014-07-31 ENCOUNTER — Other Ambulatory Visit: Payer: 59

## 2014-08-27 ENCOUNTER — Telehealth: Payer: Self-pay | Admitting: Neurology

## 2014-08-27 NOTE — Telephone Encounter (Signed)
The neuropsychological evaluation results are in. The patient appears to have mild neurocognitive disorder, unspecified. Is felt that he has some disruption of executive function. This may be related to depression or anxiety, medication effect, hypoxia from a sleep disorder, or a chronic traumatic encephalopathy. Prior evaluation with EEG study, and MRI the brain has been unremarkable.  I lead to see if patient back at some point, the patient has no revisit schedule as of yet. I will see him back in 3 or 4 months.

## 2014-08-28 NOTE — Telephone Encounter (Signed)
I called the patient and left a voicemail. He needs a f/u appointment scheduled in the next 3-4 months. He can be scheduled at 12 any day someone else is not already scheduled.  

## 2014-09-04 NOTE — Telephone Encounter (Signed)
    I called the patient and left a voicemail. He needs a f/u appointment scheduled in the next 3-4 months. He can be scheduled at 12 any day someone else is not already scheduled.

## 2014-09-05 NOTE — Telephone Encounter (Signed)
I have also mailed a letter asking the patient to call to schedule an appointment.

## 2014-09-10 NOTE — Telephone Encounter (Signed)
I called the patient and left a voicemail. He needs a f/u appointment scheduled in the next 3-4 months. He can be scheduled at 12 any day someone else is not already scheduled.

## 2014-09-11 ENCOUNTER — Telehealth: Payer: Self-pay

## 2014-09-11 NOTE — Telephone Encounter (Signed)
I called the patient and left a voicemail asking him to call back. I explained that I was sorry for the confusion but Dr. [name] actually wanted to see him in about 3-4 months, not this month. I need to schedule the patient sometime toward the end of August, and he can be scheduled any day at 12 that does not already have someone scheduled.

## 2014-09-11 NOTE — Telephone Encounter (Signed)
-----   Message from Shary Decamp sent at 09/11/2014  8:08 AM EDT ----- Regarding: Patient needs to be scheduled for 09/25/14 at 12PM. Contact: [phone] Good morning!  I spoke with patient at 4:58PM yesterday to schedule his follow up with DR. Willis for 09/25/14 at 12PM and I could schedule him for that day due to it being blocked but I did not see any other patient scheduled on that slot.   Can you schedule him for then?  Thanks!!

## 2014-09-11 NOTE — Telephone Encounter (Signed)
The patient called back. He is requesting to have an appointment in June. He is concerned he has more memory issues and believes he may be having "multiple system atrophy" because of this. He would also like to discuss his results from his neuropsych exam. Appointment scheduled for 6/16.

## 2014-09-25 ENCOUNTER — Encounter: Payer: Self-pay | Admitting: Neurology

## 2014-09-25 ENCOUNTER — Ambulatory Visit (INDEPENDENT_AMBULATORY_CARE_PROVIDER_SITE_OTHER): Payer: 59 | Admitting: Neurology

## 2014-09-25 VITALS — BP 144/91 | HR 92 | Ht 71.0 in | Wt 202.0 lb

## 2014-09-25 DIAGNOSIS — R413 Other amnesia: Secondary | ICD-10-CM

## 2014-09-25 DIAGNOSIS — M79601 Pain in right arm: Secondary | ICD-10-CM | POA: Diagnosis not present

## 2014-09-25 NOTE — Progress Notes (Addendum)
Reason for visit: Memory disorder  Charles Stevens is an 54 y.o. male  History of present illness:  Mr. Prouty is a 54 year old right-handed white male with a history of some cognitive issues that can present for several years. The patient has had a stuttering speech in the past that seems to have resolved. The patient recently went for neuropsychological evaluation, and he was told that he had some executive function issues that in part related to problems with pain, fatigue, medication use, and underlying depression and anxiety. The patient is on oxycodone on a regular basis for pain, and he takes 450 mg of Lyrica daily for pain. The patient has had several cervical spine procedures, and he has had ongoing discomfort in the right greater than left upper extremity with a cold sensation that goes down into the hand. He indicates that the Lyrica does help this some. He also has leg pain, with pain radiating down the left leg. He is followed by Dr. Sharolyn Douglas. The patient indicates that he has a history of rheumatoid arthritis. He is concerned about Ehlers-Danlos syndrome as well. He is on testosterone treatments, he reports issues with excessive sweating. He also has been on prednisone in the past. He denies any difficulty controlling the bowels or the bladder. He does have some mild balance troubles. He comes to this office for an evaluation.  Past Medical History  Diagnosis Date  . Rheumatoid arthritis(714.0)   . Attention deficit disorder   . Memory loss   . Cervical spondylosis   . Dysuria     Hx. of Dysuria  . Chronic prostatitis     Possible chronic prostatitis  . Memory loss of unknown cause     dr [name] 2014 . NP Darrol Angel   . Barrett's esophagus   . Aneurysm   . Foot drop, left     Past Surgical History  Procedure Laterality Date  . Cervical fusion    . Nose surgery      with uvulectomy    Family History  Problem Relation Age of Onset  . Cancer Mother   . Varicose  Veins Mother   . Hyperlipidemia Father   . Hypertension Father   . Diabetes Brother   . Heart disease Brother   . Peripheral vascular disease Brother     Social history:  reports that he has quit smoking. His smoking use included Pipe. He has never used smokeless tobacco. He reports that he does not drink alcohol or use illicit drugs.   No Known Allergies  Medications:  Prior to Admission medications   Medication Sig Start Date End Date Taking? Authorizing Provider  baclofen (LIORESAL) 10 MG tablet Take 10 mg by mouth 3 (three) times daily.    Yes Historical Provider, MD  celecoxib (CELEBREX) 200 MG capsule Take 200 mg by mouth daily.   Yes Historical Provider, MD  Cholecalciferol (VITAMIN D3) 10000 UNITS capsule Take 10,000 Units by mouth daily.   Yes Historical Provider, MD  DHEA 25 MG CAPS Take 75 mg by mouth daily.   Yes Historical Provider, MD  HUMIRA PEN 40 MG/0.8ML injection Inject 40 mg into the skin every 14 (fourteen) days.  07/23/12  Yes Historical Provider, MD  KRILL OIL PO Take 2,000 mg by mouth daily.   Yes Historical Provider, MD  Omeprazole (PRILOSEC PO) Take 20 mg by mouth 2 (two) times daily.    Yes Historical Provider, MD  ondansetron (ZOFRAN) 4 MG tablet Take 4 mg  by mouth every 8 (eight) hours as needed for nausea or vomiting.   Yes Historical Provider, MD  oxyCODONE (ROXICODONE) 15 MG immediate release tablet Take 15 mg by mouth every 6 (six) hours as needed for pain.    Yes Historical Provider, MD  PredniSONE 2 MG TBEC Take 2 mg by mouth daily.   Yes Historical Provider, MD  pregabalin (LYRICA) 150 MG capsule Take 150 mg by mouth 3 (three) times daily.    Yes Historical Provider, MD  STRATTERA 100 MG capsule Take 1 capsule by mouth daily. 10/19/13  Yes Historical Provider, MD  UNABLE TO FIND 45 mLs 2 (two) times a week. Med Name: Testosterone Cypiate   Yes Historical Provider, MD  UNABLE TO FIND Take 420 mg by mouth 2 (two) times daily. Med Name: Nitric oxide   Yes  Historical Provider, MD  VOLTAREN 1 % GEL Apply 2 g topically 2 (two) times daily as needed.  06/12/12  Yes Historical Provider, MD    ROS:  Out of a complete 14 system review of symptoms, the patient complains only of the following symptoms, and all other reviewed systems are negative.  Arm pain Memory disorder  Blood pressure 144/91, pulse 92, height 5\' 11"  (1.803 m), weight 202 lb (91.627 kg).  Physical Exam  General: The patient is alert and cooperative at the time of the examination.  Skin: No significant peripheral edema is noted.   Neurologic Exam  Mental status: The patient is alert and oriented x 3 at the time of the examination. The patient has apparent normal recent and remote memory, with an apparently normal attention span and concentration ability. Mini-Mental Status Examination done today shows a total score of 29/30. The patient is able to name 12 animals in 30 seconds.   Cranial nerves: Facial symmetry is present. Speech is normal, no aphasia or dysarthria is noted. Extraocular movements are full. Visual fields are full.  Motor: The patient has good strength in all 4 extremities.  Sensory examination: Soft touch sensation is symmetric on the face, arms, and legs.  Coordination: The patient has good finger-nose-finger and heel-to-shin bilaterally.  Gait and station: The patient has a normal gait. Tandem gait is normal. Romberg is negative. No drift is seen.  Reflexes: Deep tendon reflexes are symmetric.   MRI brain 02/28/14:  IMPRESSION:  Unremarkable MRI brain and pituitary (with and without).    Assessment/Plan:  1. Memory disturbance  2. Chronic pain syndrome, neck and right arm  3. Chronic low back pain  I would agree that the patient likely has some cognitive issues that may be related to medication and chronic pain. The patient is having pain mainly associated with the right arm. The patient will be set up for nerve conduction studies of both  arms and EMG of the right arm. He is concerned that he may have thoracic outlet syndrome on that side. This patient does a lot of research on line, and he comes up with a multitude of medical illnesses that he thinks that he may have. The patient may benefit from a pain center referral in the future. I do not believe that he has a primary dementing illness at this time.  03/02/14 MD 09/25/2014 5:50 PM  [facility] [address] [place], [place]  Phone [phone] Fax [phone]

## 2014-09-25 NOTE — Patient Instructions (Signed)

## 2014-09-29 ENCOUNTER — Other Ambulatory Visit: Payer: Self-pay | Admitting: Orthopaedic Surgery

## 2014-09-29 DIAGNOSIS — M4716 Other spondylosis with myelopathy, lumbar region: Secondary | ICD-10-CM

## 2014-10-02 ENCOUNTER — Other Ambulatory Visit: Payer: Self-pay | Admitting: Family Medicine

## 2014-10-02 DIAGNOSIS — R911 Solitary pulmonary nodule: Secondary | ICD-10-CM

## 2014-10-04 ENCOUNTER — Ambulatory Visit
Admission: RE | Admit: 2014-10-04 | Discharge: 2014-10-04 | Disposition: A | Discharge status: Home / Self Care | Payer: 59 | Source: Ambulatory Visit | Attending: Orthopaedic Surgery | Admitting: Orthopaedic Surgery

## 2014-10-04 DIAGNOSIS — M4716 Other spondylosis with myelopathy, lumbar region: Secondary | ICD-10-CM

## 2014-10-07 ENCOUNTER — Other Ambulatory Visit: Payer: 59

## 2014-10-09 ENCOUNTER — Encounter: Payer: Self-pay | Admitting: Neurology

## 2014-10-09 ENCOUNTER — Ambulatory Visit (INDEPENDENT_AMBULATORY_CARE_PROVIDER_SITE_OTHER): Payer: Self-pay | Admitting: Neurology

## 2014-10-09 ENCOUNTER — Telehealth: Payer: Self-pay

## 2014-10-09 ENCOUNTER — Ambulatory Visit (INDEPENDENT_AMBULATORY_CARE_PROVIDER_SITE_OTHER): Payer: 59 | Admitting: Neurology

## 2014-10-09 DIAGNOSIS — M79601 Pain in right arm: Secondary | ICD-10-CM

## 2014-10-09 DIAGNOSIS — M5010 Cervical disc disorder with radiculopathy, unspecified cervical region: Secondary | ICD-10-CM

## 2014-10-09 HISTORY — DX: Cervical disc disorder with radiculopathy, unspecified cervical region: M50.10

## 2014-10-09 MED ORDER — BUPROPION HCL ER (SR) 150 MG PO TB12: 150.0000 mg | ORAL_TABLET | Freq: Every day | ORAL | Status: DC

## 2014-10-09 NOTE — Progress Notes (Signed)
Charles Stevens comes into the office today for evaluation of right arm pain. EMG and nerve conduction study have been done showing evidence of a chronic stable right C7 radiculopathy. The patient is not clear whether or not evaluation of the cervical spine has been done since his prior cervical spine surgery. He is followed by Dr. Sharolyn Douglas. The patient has noted some atrophy in the right arm. He has ongoing pain down the right arm from the neck.  The patient will follow-up with Dr. Noel Gerold. Would consider reinvestigation of the cervical spine to ensure no ongoing nerve root compression at the C7 level on the right. Again, the findings by EMG study suggests a chronic stable nerve root lesion. I doubt that the patient has thoracic outlet syndrome.

## 2014-10-09 NOTE — Telephone Encounter (Signed)
I called the patient. The patient is suffering from PTSD. He has noted in the past that low-dose of Wellbutrin has been beneficial. He may need to make an appointment with a psychologist as well to discuss his issues. I will call in a prescription for the Wellbutrin.

## 2014-10-09 NOTE — Procedures (Signed)
     HISTORY:  Charles Stevens is a 54 year old gentleman with a history of prior cervical spine surgery who reports ongoing discomfort going down the right arm to the hand. The patient has had some muscular wasting of the right arm. The patient is being evaluated for the arm discomfort.  NERVE CONDUCTION STUDIES:  Nerve conduction studies were performed on both upper extremities. The distal motor latencies and motor amplitudes for the median and ulnar nerves were within normal limits. The F wave latencies and nerve conduction velocities for these nerves were also normal. The sensory latencies for the median and ulnar nerves were normal.   EMG STUDIES:  EMG study was performed on the right upper extremity:  The first dorsal interosseous muscle reveals 2 to 4 K units with full recruitment. No fibrillations or positive waves were noted. The abductor pollicis brevis muscle reveals 2 to 4 K units with full recruitment. No fibrillations or positive waves were noted. The extensor indicis proprius muscle reveals 2 to 5 K units with decreased recruitment. No fibrillations or positive waves were noted. The pronator teres muscle reveals 3 to 7 K units with decreased recruitment. No fibrillations or positive waves were noted. The biceps muscle reveals 1 to 2 K units with full recruitment. No fibrillations or positive waves were noted. The triceps muscle reveals 2 to 6 K units with decreased recruitment. No fibrillations or positive waves were noted. The anterior deltoid muscle reveals 2 to 3 K units with full recruitment. No fibrillations or positive waves were noted. The cervical paraspinal muscles were tested at 2 levels. No abnormalities of insertional activity were seen at either level tested. There was fair relaxation.   IMPRESSION:  Nerve conduction studies done on both upper extremities were within normal limits. No evidence of a neuropathy is seen. EMG evaluation of the right upper extremity  shows findings consistent with a chronic stable C7 radiculopathy. No other significant abnormalities were seen. The study is not completely consistent with the diagnosis of neurologic thoracic outlet syndrome.  Marlan Palau MD 10/09/2014 4:40 PM  [facility] [address] [place], [place] [address]  Phone [phone] Fax [phone]

## 2014-10-09 NOTE — Telephone Encounter (Signed)
Patient wants to know if you can refer him somewhere for depression ?

## 2014-10-09 NOTE — Progress Notes (Signed)
Please refer to EMG and nerve conduction study procedure note. 

## 2014-10-10 ENCOUNTER — Ambulatory Visit
Admission: RE | Admit: 2014-10-10 | Discharge: 2014-10-10 | Disposition: A | Discharge status: Home / Self Care | Payer: 59 | Source: Ambulatory Visit | Attending: Family Medicine | Admitting: Family Medicine

## 2014-10-10 DIAGNOSIS — R911 Solitary pulmonary nodule: Secondary | ICD-10-CM

## 2014-10-22 ENCOUNTER — Telehealth: Payer: Self-pay | Admitting: Neurology

## 2014-10-22 MED ORDER — BUPROPION HCL ER (SR) 100 MG PO TB12: 100.0000 mg | ORAL_TABLET | Freq: Every day | ORAL | Status: DC

## 2014-10-22 MED ORDER — BUPROPION HCL 100 MG PO TABS: 100.0000 mg | ORAL_TABLET | Freq: Every day | ORAL | Status: DC

## 2014-10-22 NOTE — Telephone Encounter (Signed)
Patient called and requested to have his medication Rx.  buPROPion (WELLBUTRIN SR) 150 MG 12 hr tablet changed from the extended release because it keeps him awake all night. Also wants to note a pharmacy change, he is now using [facility] at [place]. Please call and advise.

## 2014-10-22 NOTE — Telephone Encounter (Signed)
I called patient back, the patient does not wish to go on any sustained release preparation Wellbutrin. I will place him on the short-acting tablet.

## 2014-10-22 NOTE — Addendum Note (Signed)
Addended by: Stephanie Acre on: 10/22/2014 06:35 PM   Modules accepted: Orders, Medications

## 2014-10-22 NOTE — Telephone Encounter (Signed)
I called the patient. The patient is having difficulty sleeping on the Wellbutrin. Rather than switching to the short acting preparation which has an increased risk for seizures, I would prefer to go down the dose of the long-acting preparation if he is having problems with sleeping. I will cut back to 100 mg daily.

## 2014-10-22 NOTE — Telephone Encounter (Signed)
Patient is calling back and he states he cannot take buPROPion (WELLBUTRIN SR) 100 MG 12 hr tablet because he cannot tolerate taking it at all because he cannot sleep. Please call and discuss. When calling if it goes to VM call right back because of the patient's location.

## 2014-11-25 ENCOUNTER — Encounter: Payer: Self-pay | Admitting: Vascular Surgery

## 2014-11-26 ENCOUNTER — Ambulatory Visit (HOSPITAL_COMMUNITY)
Admission: RE | Admit: 2014-11-26 | Discharge: 2014-11-26 | Disposition: A | Discharge status: Home / Self Care | Payer: 59 | Source: Ambulatory Visit | Attending: Vascular Surgery | Admitting: Vascular Surgery

## 2014-11-26 ENCOUNTER — Ambulatory Visit (INDEPENDENT_AMBULATORY_CARE_PROVIDER_SITE_OTHER): Payer: 59 | Admitting: Vascular Surgery

## 2014-11-26 ENCOUNTER — Encounter: Payer: Self-pay | Admitting: Vascular Surgery

## 2014-11-26 VITALS — BP 136/94 | HR 84 | Temp 98.2°F | Resp 16 | Ht 72.0 in | Wt 195.0 lb

## 2014-11-26 DIAGNOSIS — I723 Aneurysm of iliac artery: Secondary | ICD-10-CM

## 2014-11-26 NOTE — Progress Notes (Signed)
Filed Vitals:   11/26/14 1028 11/26/14 1037  BP: 142/93 136/94  Pulse: 85 84  Temp: 98.2 F (36.8 C)   TempSrc: Oral   Resp: 16   Height: 6' (1.829 m)   Weight: 195 lb (88.451 kg)   SpO2: 98%

## 2014-11-26 NOTE — Progress Notes (Signed)
Patient ID: Charles Stevens, male   DOB: [birth year], 54 y.o.   MRN: [id number]   [facility]  Patient name: Charles Stevens MRN: [id number] DOB: 1961-02-20 Sex: male  REASON FOR VISIT: Follow up of left common iliac artery aneurysm  HPI: Charles Stevens is a 54 y.o. male who I saw in consultation with the left common iliac artery aneurysm on 05/22/2014. He was having repeated episodes of C. Difficile which prompted the CT scan and this was an incidental finding. The aneurysm was 2.5 cm in maximum diameter. I recommended a follow up study in 6 months.   Since I saw him last, he denies any significant abdominal pain or back pain. He does use nicotine gum but does not smoke. He states that his blood pressure normally runs high. He is not on medication for blood pressure.  Past Medical History  Diagnosis Date  . Rheumatoid arthritis(714.0)   . Attention deficit disorder   . Memory loss   . Cervical spondylosis   . Dysuria     Hx. of Dysuria  . Chronic prostatitis     Possible chronic prostatitis  . Memory loss of unknown cause     dr [name] 2014 . NP Darrol Angel   . Barrett's esophagus   . Aneurysm   . Foot drop, left   . Cervical disc disorder with radiculopathy of cervical region 10/09/2014    Chronic right C7 radiculopathy  . Anemia    Family History  Problem Relation Age of Onset  . Cancer Mother     Colon, Breast, Liver  . Varicose Veins Mother   . Hyperlipidemia Mother   . Hyperlipidemia Father   . Hypertension Father   . Heart attack Father   . Diabetes Brother   . Heart disease Brother   . Peripheral vascular disease Brother   . Deep vein thrombosis Brother   . Heart attack Brother    SOCIAL HISTORY: Social History  Substance Use Topics  . Smoking status: Former Smoker    Types: Pipe  . Smokeless tobacco: Never Used     Comment: Quit in April  . Alcohol Use: No   No Known Allergies Current Outpatient Prescriptions  Medication Sig  Dispense Refill  . aspirin 81 MG tablet Take 81 mg by mouth daily.    . baclofen (LIORESAL) 10 MG tablet Take 10 mg by mouth 3 (three) times daily.     Marland Kitchen buPROPion (WELLBUTRIN) 100 MG tablet Take 1 tablet (100 mg total) by mouth daily. 30 tablet 1  . celecoxib (CELEBREX) 200 MG capsule Take 200 mg by mouth daily.    . Cholecalciferol (VITAMIN D3) 10000 UNITS capsule Take 10,000 Units by mouth daily.    Marland Kitchen DHEA 25 MG CAPS Take 75 mg by mouth daily.    Marland Kitchen HUMIRA PEN 40 MG/0.8ML injection Inject 40 mg into the skin every 14 (fourteen) days.     Marland Kitchen KRILL OIL PO Take 2,000 mg by mouth daily.    . Omeprazole (PRILOSEC PO) Take 20 mg by mouth 2 (two) times daily.     . ondansetron (ZOFRAN) 4 MG tablet Take 4 mg by mouth every 8 (eight) hours as needed for nausea or vomiting.    Marland Kitchen oxyCODONE (ROXICODONE) 15 MG immediate release tablet Take 15 mg by mouth every 6 (six) hours as needed for pain.     . PredniSONE 2 MG TBEC Take 2 mg by mouth daily.    Marland Kitchen  pregabalin (LYRICA) 150 MG capsule Take 150 mg by mouth 3 (three) times daily.     Marland Kitchen STRATTERA 100 MG capsule Take 1 capsule by mouth daily.    Marland Kitchen UNABLE TO FIND 45 mLs 2 (two) times a week. Med Name: Testosterone Cypiate    . UNABLE TO FIND Take 420 mg by mouth 2 (two) times daily. Med Name: Nitric oxide    . VOLTAREN 1 % GEL Apply 2 g topically 2 (two) times daily as needed.      No current facility-administered medications for this visit.   REVIEW OF SYSTEMS: Arly.Keller ] denotes positive finding; [  ] denotes negative finding  CARDIOVASCULAR:  Arly.Keller ] chest pain - this sounds musculoskeletal  [ ]  chest pressure   ] palpitations   [ ]  orthopnea   [ ]  dyspnea on exertion   [ ]  claudication   [ ]  rest pain   [ ]  DVT   [ ]  phlebitis PULMONARY:   [ ]  productive cough   [ ]  asthma   [ ]  wheezing NEUROLOGIC:   [ ]  weakness  [ ]  paresthesias  [ ]  aphasia  [ ]  amaurosis  Arly.Keller ] dizziness HEMATOLOGIC:   [ ]  bleeding problems   [ ]  clotting disorders MUSCULOSKELETAL:  [ ]   joint pain   [ ]  joint swelling [ ]  leg swelling GASTROINTESTINAL: [ ]   blood in stool  [ ]   hematemesis GENITOURINARY:  [ ]   dysuria  [ ]   hematuria PSYCHIATRIC:  [ ]  history of major depression INTEGUMENTARY:  [ ]  rashes  [ ]  ulcers CONSTITUTIONAL:  [ ]  fever   [ ]  chills  PHYSICAL EXAM: Filed Vitals:   11/26/14 1028 11/26/14 1037  BP: 142/93 136/94  Pulse: 85 84  Temp: 98.2 F (36.8 C)   TempSrc: Oral   Resp: 16   Height: 6' (1.829 m)   Weight: 195 lb (88.451 kg)   SpO2: 98%    GENERAL: The patient is a well-nourished male, in no acute distress. The vital signs are documented above. CARDIAC: There is a regular rate and rhythm.  VASCULAR: I do not detect carotid bruits. He has palpable femoral popliteal and pedal pulses bilaterally. PULMONARY: There is good air exchange bilaterally without wheezing or rales. ABDOMEN: Soft and non-tender with normal pitched bowel sounds.  MUSCULOSKELETAL: There are no major deformities or cyanosis. NEUROLOGIC: No focal weakness or paresthesias are detected. SKIN: There are no ulcers or rashes noted. PSYCHIATRIC: The patient has a normal affect.  DATA:  I have independently interpreted his duplex of his aneurysm today. The maximum diameter of his left common iliac artery aneurysm is 2.4 cm which has not changed compared to 2.5 cm by CT scan 6 months ago. The maximum diameter of his infrarenal aorta is 2.8 cm.  MEDICAL ISSUES:  LEFT COMMON ILIAC ARTERY ANEURYSM: this remains stable in size. He understands we would not consider elective repair less than 3.5 cm in maximum diameter or greater. I again discussed the importance of trying to get off nicotine completely. I also discussed the importance of blood pressure control as these are the 2 factors associated with continued aneurysm enlargement. I think it is safe to stretch his follow up about 9 months given that the aneurysm is stable in size. I'll see him back at that time. He is to call sooner if  he has problems.  HYPERTENSION: The patient's initial blood pressure today was elevated. We repeated this and this was  still elevated. We have encouraged the patient to follow up with their primary care physician for management of their blood pressure.   No Follow-up on file.   Waverly Ferrari [facility]: [phone]

## 2014-12-16 ENCOUNTER — Telehealth: Payer: Self-pay | Admitting: Neurology

## 2014-12-16 DIAGNOSIS — F411 Generalized anxiety disorder: Secondary | ICD-10-CM

## 2014-12-16 MED ORDER — MIRTAZAPINE 15 MG PO TABS: 15.0000 mg | ORAL_TABLET | Freq: Every day | ORAL | Status: DC

## 2014-12-16 NOTE — Telephone Encounter (Signed)
I called patient. The patient is not sleeping well, high anxiety level. The patient will be taken off of Wellbutrin, switched to mirtazapine, starting at 15 mg at night. I will refer him to the office of Dr. Andee Poles to see one of her therapists.

## 2014-12-16 NOTE — Telephone Encounter (Signed)
Patient called stating his wife has left about a month ago. He needs something for anxiety, not sleepy well(usually awake until sunrise), decreased appetite, not functioning well. The Wellbutrin was working well prior to this.  He is also requesting a referral for psychiatrist or therapist. Please call and advise. Patient can be reached at [phone].

## 2014-12-19 ENCOUNTER — Telehealth: Payer: Self-pay | Admitting: Neurology

## 2015-01-01 ENCOUNTER — Telehealth: Payer: Self-pay | Admitting: Neurology

## 2015-01-01 NOTE — Telephone Encounter (Signed)
I called patient. The 15 mg of Remeron tablet help for several nights, the effects of worn off. I would try the 30 mg dosing, if this is not enough, could go up to 45 mg. The patient is to contact me depending on how he is doing.

## 2015-01-01 NOTE — Telephone Encounter (Signed)
Patient received vmail from Dr. [name], states Dr. [name] left clear information about 1 medication but not the other.

## 2015-01-01 NOTE — Telephone Encounter (Signed)
Patient called stating mirtazapine (REMERON) 15 MG tablet is not working. States he slept for 3 nights and has not helped since. He started taking Wellbutin again as chronic fatigue and uncontrollable crying began when he stopped. He is requesting something for sleep. He has an appt with psychiartist on Monday 9/26 with Dr Jonnalagada/[facility]. Please call and advise at [phone].

## 2015-01-01 NOTE — Telephone Encounter (Signed)
I called patient. The patient is on low-dose Wellbutrin at 100 mg daily. Okay to go up on the Remeron taking 30 milligrams at night.

## 2015-01-05 NOTE — Telephone Encounter (Signed)
I called the patient. He has no further questions about his medications.

## 2015-01-12 ENCOUNTER — Telehealth: Payer: Self-pay | Admitting: Neurology

## 2015-01-12 MED ORDER — MIRTAZAPINE 15 MG PO TABS: 30.0000 mg | ORAL_TABLET | Freq: Every day | ORAL | Status: DC

## 2015-01-12 NOTE — Telephone Encounter (Signed)
Per previous note:  Charles Spaniel, MD at 01/01/2015 4:51 PM     Status: Signed       Expand All Collapse All   I called patient. The 15 mg of Remeron tablet help for several nights, the effects of worn off. I would try the 30 mg dosing,

## 2015-01-12 NOTE — Telephone Encounter (Signed)
Pt called requesting refill for mirtazapine (REMERON) 15 MG tablet . He states doseage was increased to 30mg /day per Dr instructions and needs new RX sent to pharmacy.

## 2015-01-14 ENCOUNTER — Ambulatory Visit (INDEPENDENT_AMBULATORY_CARE_PROVIDER_SITE_OTHER): Payer: 59

## 2015-01-14 ENCOUNTER — Encounter: Payer: Self-pay | Admitting: Podiatry

## 2015-01-14 ENCOUNTER — Ambulatory Visit: Payer: 59

## 2015-01-14 ENCOUNTER — Ambulatory Visit (INDEPENDENT_AMBULATORY_CARE_PROVIDER_SITE_OTHER): Payer: 59 | Admitting: Podiatry

## 2015-01-14 VITALS — BP 130/76 | HR 86 | Resp 16 | Ht 72.0 in | Wt 195.0 lb

## 2015-01-14 DIAGNOSIS — Z0189 Encounter for other specified special examinations: Secondary | ICD-10-CM

## 2015-01-14 DIAGNOSIS — S93602A Unspecified sprain of left foot, initial encounter: Secondary | ICD-10-CM | POA: Diagnosis not present

## 2015-01-14 DIAGNOSIS — T148 Other injury of unspecified body region: Secondary | ICD-10-CM | POA: Diagnosis not present

## 2015-01-14 DIAGNOSIS — M779 Enthesopathy, unspecified: Secondary | ICD-10-CM | POA: Diagnosis not present

## 2015-01-14 DIAGNOSIS — T148XXA Other injury of unspecified body region, initial encounter: Secondary | ICD-10-CM

## 2015-01-14 DIAGNOSIS — Q6650 Congenital pes planus, unspecified foot: Secondary | ICD-10-CM | POA: Diagnosis not present

## 2015-01-14 NOTE — Progress Notes (Signed)
Subjective:     Patient ID: Charles Stevens, male   DOB: 13-Oct-1960, 54 y.o.   MRN: [id number]  HPI patient presents stating I have had collapse my arch over the last several months and I was concerned because it's been bothering me. Patient states that he's in a boot currently and that's been helpful and he's had a previous injection. He states it feels like he needs more support underneath and he does need to keep working and he needs to be weightbearing   Review of Systems  All other systems reviewed and are negative.      Objective:   Physical Exam  Constitutional: He is oriented to person, place, and time.  Cardiovascular: Intact distal pulses.   Musculoskeletal: Normal range of motion.  Neurological: He is oriented to person, place, and time.  Skin: Skin is warm.  Nursing note and vitals reviewed.  neurovascular status found to be intact muscle strength adequate range of motion within normal limits of the subtalar joint. Moderate collapse medial longitudinal arch left over right with mild edema in the medial side of the left ankle that's moderately tender when pressed but no indication of complete tear of the posterior tibial tendon. Appears to be functioning just not functioning properly with probable stretch and possibility for interstitial tear. Patient does have significant collapse though of the medial longitudinal arch upon gait evaluation     Assessment:     Reduced function posterior tibial tendon left with collapse medial longitudinal arch creating instability factor    Plan:     H&P condition reviewed with patient. I've recommended a AFO full brace with a hinge in order to completely support the plantar arch and prevent further dysfunction. I did explain that ultimately we may need to get an MRI and that the possibility exists that this patient and that needing surgery. Patient understands this and is scheduled for brace therapy and will continue boot usage to try to continue  to reduce the acute inflammation

## 2015-01-14 NOTE — Progress Notes (Signed)
   Subjective:    Patient ID: Charles Stevens, male    DOB: 12-24-1960, 54 y.o.   MRN: [id number]  HPI Patient presents with ankle pain in their left foot, medial side, swollen. Pt stated, "arch is completely collapsed". P also said, "tendon hurts". This has been going on since July 2016.  Patient also presents with toe pain in their left foot, great toe-toe is leaning into 2nd toe. Pt has Rheumatoid Arthritis.   Review of Systems  Constitutional: Positive for fatigue.  HENT: Positive for sinus pressure and trouble swallowing.   Genitourinary: Positive for urgency.  Skin: Positive for color change.  Neurological: Positive for tremors.  Hematological: Bruises/bleeds easily.  All other systems reviewed and are negative.      Objective:   Physical Exam        Assessment & Plan:

## 2015-01-19 ENCOUNTER — Telehealth: Payer: Self-pay | Admitting: Neurology

## 2015-01-19 MED ORDER — BUPROPION HCL 100 MG PO TABS: 100.0000 mg | ORAL_TABLET | Freq: Every day | ORAL | Status: DC

## 2015-01-19 NOTE — Telephone Encounter (Signed)
I called the patient. He is doing well with the 100 mg Wellbutrin tablets, I will call this in.

## 2015-01-19 NOTE — Telephone Encounter (Signed)
Pt called and states that using buPROPion (WELLBUTRIN) 100 MG tablet is working and would like a Rx . Pt says he found an older rx of the Wellbutrin at 150mg  , pt cut it in half and says that 75mg  works great. Please call and advise

## 2015-01-21 ENCOUNTER — Ambulatory Visit: Payer: 59 | Admitting: *Deleted

## 2015-01-21 DIAGNOSIS — T148XXA Other injury of unspecified body region, initial encounter: Secondary | ICD-10-CM

## 2015-01-21 NOTE — Progress Notes (Signed)
Patient ID: Charles Stevens, male   DOB: 01/29/1961, 54 y.o.   MRN: [id number] Patient presents for brace casting with [facility]

## 2015-02-18 ENCOUNTER — Ambulatory Visit: Payer: 59 | Admitting: *Deleted

## 2015-02-18 DIAGNOSIS — M2142 Flat foot [pes planus] (acquired), left foot: Secondary | ICD-10-CM

## 2015-02-18 DIAGNOSIS — T148XXA Other injury of unspecified body region, initial encounter: Secondary | ICD-10-CM

## 2015-02-18 DIAGNOSIS — M66872 Spontaneous rupture of other tendons, left ankle and foot: Secondary | ICD-10-CM

## 2015-02-18 NOTE — Progress Notes (Signed)
Patient ID: Charles Stevens, male   DOB: Aug 11, 1960, 54 y.o.   MRN: [id number] Patient presents for fitting of [place] brace with [facility] Certified Pedorthist. Written and verbal break in instructions given. Patient will follow up in 6 weeks with Dr. Charlsie Merles.

## 2015-03-23 ENCOUNTER — Encounter: Payer: Self-pay | Admitting: Podiatry

## 2015-03-23 ENCOUNTER — Ambulatory Visit (INDEPENDENT_AMBULATORY_CARE_PROVIDER_SITE_OTHER): Payer: 59 | Admitting: Podiatry

## 2015-03-23 ENCOUNTER — Telehealth: Payer: Self-pay | Admitting: *Deleted

## 2015-03-23 VITALS — BP 130/90 | HR 82 | Resp 16

## 2015-03-23 DIAGNOSIS — T148 Other injury of unspecified body region: Secondary | ICD-10-CM | POA: Diagnosis not present

## 2015-03-23 DIAGNOSIS — T148XXA Other injury of unspecified body region, initial encounter: Secondary | ICD-10-CM

## 2015-03-23 DIAGNOSIS — Q6650 Congenital pes planus, unspecified foot: Secondary | ICD-10-CM

## 2015-03-23 DIAGNOSIS — M779 Enthesopathy, unspecified: Secondary | ICD-10-CM | POA: Diagnosis not present

## 2015-03-23 NOTE — Telephone Encounter (Signed)
Faxed orders for MRI foot and ankle left.

## 2015-03-24 NOTE — Progress Notes (Signed)
Subjective:     Patient ID: Charles Stevens, male   DOB: 1960/12/20, 54 y.o.   MRN: [id number]  HPI patient states my left foot is getting worse and it is in a more significant fashion arch is falling and it's becoming increasingly painful. Currently I cannot wear the air zone a brace due to the inflammation around the bone on the inside and I aspirate we need to be able to exercise and cannot due to the intense discomfort on   Review of Systems     Objective:   Physical Exam Neurovascular status intact muscle strength is adequate with patient having severe flatfoot deformity left with edema around the posterior tibial tendon both in the area of the medial malleolus and its insertion into the navicular. There is continued collapse of the arch that is occurring currently    Assessment:     Severe acute flatfoot deformity left with dysfunction of the posterior tibial tendon which is worsening and failure to respond to conservative bracing therapy    Plan:     Spent a great deal of time reviewing and at this time we are going to get an MRI to understand as to whether or not there is a rupture or interstitial tear of the left posterior tibial with this patient going to require surgery that we'll most likely require medial column stabilization along with possibility for either lateral column stabilization or subtalar joint fusion. Patient will be seen back after we get results of MRI to decide what we'll be the best surgical course for

## 2015-03-25 NOTE — Telephone Encounter (Addendum)
[employer] prior authorization for MRI  Left ankle 73721, dx T14.8 - APPROVED [phone], VALID TO 05/08/2015.  MRI left foot 73718, T14.8 sent to Nurse Review - Ms Cherylin Mylar [phone] for Case #[id number], then to Doctor's Review determination in 2 business days.  03/30/2015  - [employer] and [employer] denied MRI coverage of the left foot.  MRI left foot cancelled with [facility].  I spoke with radiology technician - Consuella Lose, she stated that the MRI of the left ankle would include the midfoot no toes and can specifically look at structures that are requested in the ankle and midfoot areas.  I specified with the scheduler the areas of the posterior tibial tendon, medial malleolus and the insertion area of the navicular.

## 2015-04-02 ENCOUNTER — Ambulatory Visit (INDEPENDENT_AMBULATORY_CARE_PROVIDER_SITE_OTHER): Payer: 59 | Admitting: Neurology

## 2015-04-02 ENCOUNTER — Other Ambulatory Visit: Payer: Self-pay | Admitting: Family Medicine

## 2015-04-02 ENCOUNTER — Ambulatory Visit
Admission: RE | Admit: 2015-04-02 | Discharge: 2015-04-02 | Disposition: A | Discharge status: Home / Self Care | Payer: 59 | Source: Ambulatory Visit | Attending: Podiatry | Admitting: Podiatry

## 2015-04-02 ENCOUNTER — Other Ambulatory Visit: Payer: 59

## 2015-04-02 ENCOUNTER — Encounter: Payer: Self-pay | Admitting: Neurology

## 2015-04-02 VITALS — BP 136/98 | HR 73 | Ht 72.0 in | Wt 200.0 lb

## 2015-04-02 DIAGNOSIS — M5010 Cervical disc disorder with radiculopathy, unspecified cervical region: Secondary | ICD-10-CM | POA: Diagnosis not present

## 2015-04-02 DIAGNOSIS — R413 Other amnesia: Secondary | ICD-10-CM

## 2015-04-02 DIAGNOSIS — M779 Enthesopathy, unspecified: Secondary | ICD-10-CM

## 2015-04-02 DIAGNOSIS — R911 Solitary pulmonary nodule: Secondary | ICD-10-CM

## 2015-04-02 DIAGNOSIS — Q6650 Congenital pes planus, unspecified foot: Secondary | ICD-10-CM

## 2015-04-02 DIAGNOSIS — T148XXA Other injury of unspecified body region, initial encounter: Secondary | ICD-10-CM

## 2015-04-02 NOTE — Progress Notes (Signed)
Reason for visit:  Memory disorder  Charles Stevens is an 54 y.o. male  History of present illness:   Charles Stevens is a 54 year old right-handed white male with a history of a mild memory disorder. The patient has a history of cervical spine disease, he has had decompression, but he has a residual C4-5 myelopathy associated with prior cord compression. The patient has done much better since last seen. He has been placed on propranolol taking 80 mg twice daily. He feels that this has helped him sleep much better, he feels cognitively much improved, but not quite back to normal. The patient does have some problems with autonomic dysfunction associated with his cervical myelopathy. He reports episodes of profuse sweating at times, this may be brought on by taking a warm shower, or with physical exertion. He reports some urinary urgency , he has palpitations of the heart, tachycardia, and a history of orthostatic hypotension. The patient has a history of rheumatoid arthritis as well. He is on Humira. His stuttering speech problems have improved as the fatigue level has improved. The patient returns for an evaluation. He continues to have ongoing chronic pain, he will sometimes wake up with pain in the morning as his oral opiate medication is not lasting throughout the night.  Past Medical History  Diagnosis Date  . Rheumatoid arthritis(714.0)   . Attention deficit disorder   . Memory loss   . Cervical spondylosis   . Dysuria     Hx. of Dysuria  . Chronic prostatitis     Possible chronic prostatitis  . Memory loss of unknown cause     dr Charles Stevens 2014 . NP Charles Stevens   . Barrett's esophagus   . Aneurysm (HCC)   . Foot drop, left   . Cervical disc disorder with radiculopathy of cervical region 10/09/2014    Chronic right C7 radiculopathy  . Anemia     Past Surgical History  Procedure Laterality Date  . Cervical fusion  2012, 2013 and 2014  . Nose surgery      with uvulectomy    Family  History  Problem Relation Age of Onset  . Cancer Mother     Colon, Breast, Liver  . Varicose Veins Mother   . Hyperlipidemia Mother   . Hyperlipidemia Father   . Hypertension Father   . Heart attack Father   . Diabetes Brother   . Heart disease Brother   . Peripheral vascular disease Brother   . Deep vein thrombosis Brother   . Heart attack Brother     Social history:  reports that he has quit smoking. His smoking use included Pipe. He has never used smokeless tobacco. He reports that he does not drink alcohol or use illicit drugs.   No Known Allergies  Medications:  Prior to Admission medications   Medication Sig Start Date End Date Taking? Authorizing Provider  amoxicillin-clavulanate (AUGMENTIN) 500-125 MG tablet Take 1 tablet by mouth daily.   Yes Historical Provider, MD  aspirin 81 MG tablet Take 81 mg by mouth daily.   Yes Historical Provider, MD  baclofen (LIORESAL) 10 MG tablet Take 10 mg by mouth 3 (three) times daily.    Yes Historical Provider, MD  buPROPion (WELLBUTRIN SR) 150 MG 12 hr tablet Take 150 mg by mouth daily.   Yes Historical Provider, MD  celecoxib (CELEBREX) 200 MG capsule Take 200 mg by mouth daily.   Yes Historical Provider, MD  Cholecalciferol (VITAMIN D3) 10000 UNITS capsule Take  10,000 Units by mouth daily.   Yes Historical Provider, MD  DHEA 25 MG CAPS Take 75 mg by mouth daily.   Yes Historical Provider, MD  HUMIRA PEN 40 MG/0.8ML injection Inject 40 mg into the skin every 14 (fourteen) days.  07/23/12  Yes Historical Provider, MD  KRILL OIL PO Take 2,000 mg by mouth daily.   Yes Historical Provider, MD  mirtazapine (REMERON) 45 MG tablet Take 45 mg by mouth at bedtime.   Yes Historical Provider, MD  olmesartan (BENICAR) 20 MG tablet Take 20-40 mg by mouth daily.   Yes Historical Provider, MD  Omeprazole (PRILOSEC PO) Take 20 mg by mouth 2 (two) times daily.    Yes Historical Provider, MD  ondansetron (ZOFRAN) 4 MG tablet Take 4 mg by mouth every 8  (eight) hours as needed for nausea or vomiting.   Yes Historical Provider, MD  oxyCODONE (ROXICODONE) 15 MG immediate release tablet Take 15 mg by mouth every 6 (six) hours as needed for pain.    Yes Historical Provider, MD  pregabalin (LYRICA) 150 MG capsule Take 150 mg by mouth 3 (three) times daily.    Yes Historical Provider, MD  propranolol (INDERAL) 80 MG tablet Take 80 mg by mouth 2 (two) times daily.   Yes Historical Provider, MD  STRATTERA 100 MG capsule Take 1 capsule by mouth daily. 10/19/13  Yes Historical Provider, MD  UNABLE TO FIND 45 mLs 2 (two) times a week. Med Name: Testosterone Cypiate   Yes Historical Provider, MD  UNABLE TO FIND Take 420 mg by mouth 2 (two) times daily. Med Name: Nitric oxide   Yes Historical Provider, MD  VOLTAREN 1 % GEL Apply 2 g topically 2 (two) times daily as needed.  06/12/12  Yes Historical Provider, MD    ROS:  Out of a complete 14 system review of symptoms, the patient complains only of the following symptoms, and all other reviewed systems are negative.   Excessive sweating  Blurred vision  Cold intolerance, heat intolerance  Constipation, diarrhea, nausea, swollen abdomen  Restless legs, insomnia  Acting out dreams  Frequency of urination, urinary urgency  Joint pain, back pain, achy muscles, muscle cramps, walking difficulty, neck pain, neck stiffness  Blood pressure 136/98, pulse 73, height 6' (1.829 m), weight 200 lb (90.719 kg).    Blood pressure, left arm, standing is 108/88. Blood pressure, left arm, sitting is 128/90.  Physical Exam  General: The patient is alert and cooperative at the time of the examination.  Skin: No significant peripheral edema is noted.   Neurologic Exam  Mental status: The patient is alert and oriented x 3 at the time of the examination. The patient has apparent normal recent and remote memory, with an apparently normal attention span and concentration ability. Mini-Mental status examination done today  shows a total score of 30/30. The patient is able to name 12 animals in 30 seconds.   Cranial nerves: Facial symmetry is present. Speech is normal, no aphasia or dysarthria is noted. Extraocular movements are full. Visual fields are full.  Motor: The patient has good strength in all 4 extremities.  Sensory examination: Soft touch sensation is symmetric on the face, arms, and legs.  Coordination: The patient has good finger-nose-finger and heel-to-shin bilaterally.  Gait and station: The patient has a normal gait. Tandem gait is normal. Romberg is negative. No drift is seen.  Reflexes: Deep tendon reflexes are symmetric.   Assessment/Plan:   1. Cervical myelopathy, C4-5 level  2. Mild memory disturbance   3. Autonomic dysfunction   4. Chronic pain syndrome   The patient overall is doing much better at this time. He feels that he is functioning much better cognitively as he is sleeping better. The propranolol seems to help improve the tachycardia issues and, and some of the autonomic nervous system dysfunction problems. This is likely related to the chronic cervical myelopathy. The patient indicates that his pain control is not adequate at nighttime, he wishes to switch over to a long-acting opiate medication. He is followed by Dr. Sharolyn Douglas for this. He will follow-up to this office in about 6 months, we will follow the memory issues. The patient may benefit from a slight increase in dosing of the propranolol.  Marlan Palau MD 04/03/2015 9:21 AM  [facility] [address] [place], [place] [address]  Phone [phone] Fax [phone]

## 2015-04-06 NOTE — Telephone Encounter (Signed)
He can have the ankle mri and should give [place] enough information

## 2015-04-07 ENCOUNTER — Ambulatory Visit
Admission: RE | Admit: 2015-04-07 | Discharge: 2015-04-07 | Disposition: A | Discharge status: Home / Self Care | Payer: 59 | Source: Ambulatory Visit | Attending: Family Medicine | Admitting: Family Medicine

## 2015-04-07 DIAGNOSIS — R911 Solitary pulmonary nodule: Secondary | ICD-10-CM

## 2015-05-16 ENCOUNTER — Other Ambulatory Visit: Payer: Self-pay | Admitting: Neurology

## 2015-06-01 ENCOUNTER — Emergency Department (HOSPITAL_BASED_OUTPATIENT_CLINIC_OR_DEPARTMENT_OTHER): Payer: BLUE CROSS/BLUE SHIELD

## 2015-06-01 ENCOUNTER — Emergency Department (HOSPITAL_BASED_OUTPATIENT_CLINIC_OR_DEPARTMENT_OTHER)
Admission: EM | Admit: 2015-06-01 | Discharge: 2015-06-02 | Disposition: A | Discharge status: Home / Self Care | Payer: BLUE CROSS/BLUE SHIELD | Attending: Emergency Medicine | Admitting: Emergency Medicine

## 2015-06-01 ENCOUNTER — Encounter (HOSPITAL_BASED_OUTPATIENT_CLINIC_OR_DEPARTMENT_OTHER): Payer: Self-pay | Admitting: Emergency Medicine

## 2015-06-01 DIAGNOSIS — Z87438 Personal history of other diseases of male genital organs: Secondary | ICD-10-CM | POA: Diagnosis not present

## 2015-06-01 DIAGNOSIS — Z7982 Long term (current) use of aspirin: Secondary | ICD-10-CM | POA: Insufficient documentation

## 2015-06-01 DIAGNOSIS — Z8719 Personal history of other diseases of the digestive system: Secondary | ICD-10-CM | POA: Insufficient documentation

## 2015-06-01 DIAGNOSIS — Z791 Long term (current) use of non-steroidal anti-inflammatories (NSAID): Secondary | ICD-10-CM | POA: Diagnosis not present

## 2015-06-01 DIAGNOSIS — M79662 Pain in left lower leg: Secondary | ICD-10-CM

## 2015-06-01 DIAGNOSIS — Z792 Long term (current) use of antibiotics: Secondary | ICD-10-CM | POA: Insufficient documentation

## 2015-06-01 DIAGNOSIS — M7989 Other specified soft tissue disorders: Secondary | ICD-10-CM | POA: Diagnosis not present

## 2015-06-01 DIAGNOSIS — Z794 Long term (current) use of insulin: Secondary | ICD-10-CM | POA: Insufficient documentation

## 2015-06-01 DIAGNOSIS — Z862 Personal history of diseases of the blood and blood-forming organs and certain disorders involving the immune mechanism: Secondary | ICD-10-CM | POA: Insufficient documentation

## 2015-06-01 DIAGNOSIS — Z79891 Long term (current) use of opiate analgesic: Secondary | ICD-10-CM | POA: Insufficient documentation

## 2015-06-01 DIAGNOSIS — M069 Rheumatoid arthritis, unspecified: Secondary | ICD-10-CM | POA: Diagnosis not present

## 2015-06-01 DIAGNOSIS — Z8659 Personal history of other mental and behavioral disorders: Secondary | ICD-10-CM | POA: Diagnosis not present

## 2015-06-01 NOTE — ED Provider Notes (Signed)
CSN: [id number]     Arrival date & time 06/01/15  2053 History  By signing my name below, I, Charles Stevens, attest that this documentation has been prepared under the direction and in the presence of [name], MD. Electronically Signed: Tanda Stevens, ED Scribe. 06/01/2015. 11:46 PM.   Chief Complaint  Patient presents with  . Leg Swelling   The history is provided by the patient. No language interpreter was used.     HPI Comments: Charles Stevens is a 55 y.o. male who presents to the Emergency Department with a history of rheumatoid arthritis complaining of gradual onset, constant, 4/10, left calf pain and swelling x 5 days. He is concerned about a DVT. Pt has been taking his oxycodone and Celebrex with some relief. He mentions that he felt a sudden onset of pain in his left distal quadriceps and patellar ligament 2 weeks ago while walking but that pain has subsequently resolved. Pt denies numbness, weakness, tingling or any other associated symptoms.   Pt also mentions that he has had left foot and left ankle pain since June 2016 and is convinced he fractured his ankle. He has been seen at [facility] and by orthopedist, Dr. [name], for this pain. Pt had an MRI done in December 2016 which showed tendon detachment in his left foot. Dr. [name] told pt that it could not be repaired. He is planning to follow[facility]up [facility].  Past Medical History  Diagnosis Date  . Rheumatoid arthritis(714.0)   . Attention deficit disorder   . Memory loss   . Cervical spondylosis   . Dysuria     Hx. of Dysuria  . Chronic prostatitis     Possible chronic prostatitis  . Memory loss of unknown cause     dr [name] 2014 . NP Darrol Angel   . Barrett's esophagus   . Aneurysm (HCC)   . Foot drop, left   . Cervical disc disorder with radiculopathy of cervical region 10/09/2014    Chronic right C7 radiculopathy  . Anemia    Past Surgical History  Procedure Laterality Date  . Cervical  fusion  2012, 2013 and 2014  . Nose surgery      with uvulectomy   Family History  Problem Relation Age of Onset  . Cancer Mother     Colon, Breast, Liver  . Varicose Veins Mother   . Hyperlipidemia Mother   . Hyperlipidemia Father   . Hypertension Father   . Heart attack Father   . Diabetes Brother   . Heart disease Brother   . Peripheral vascular disease Brother   . Deep vein thrombosis Brother   . Heart attack Brother    Social History  Substance Use Topics  . Smoking status: Former Smoker    Types: Pipe  . Smokeless tobacco: Never Used     Comment: Quit in April  . Alcohol Use: No    Review of Systems  A complete 10 system review of systems was obtained and all systems are negative except as noted in the HPI and PMH.   Allergies  Review of patient's allergies indicates no known allergies.  Home Medications   Prior to Admission medications   Medication Sig Start Date End Date Taking? Authorizing Provider  amoxicillin-clavulanate (AUGMENTIN) 500-125 MG tablet Take 1 tablet by mouth daily.    Historical Provider, MD  aspirin 81 MG tablet Take 81 mg by mouth daily.    Historical Provider, MD  baclofen (  LIORESAL) 10 MG tablet Take 10 mg by mouth 3 (three) times daily.     Historical Provider, MD  buPROPion (WELLBUTRIN SR) 150 MG 12 hr tablet Take 150 mg by mouth daily.    Historical Provider, MD  celecoxib (CELEBREX) 200 MG capsule Take 200 mg by mouth daily.    Historical Provider, MD  Cholecalciferol (VITAMIN D3) 10000 UNITS capsule Take 10,000 Units by mouth daily.    Historical Provider, MD  DHEA 25 MG CAPS Take 75 mg by mouth daily.    Historical Provider, MD  HUMIRA PEN 40 MG/0.8ML injection Inject 40 mg into the skin every 14 (fourteen) days.  07/23/12   Historical Provider, MD  KRILL OIL PO Take 2,000 mg by mouth daily.    Historical Provider, MD  mirtazapine (REMERON) 15 MG tablet TAKE 2 TABLETS (30 MG TOTAL) BY MOUTH AT BEDTIME. 05/19/15   [name], MD   mirtazapine (REMERON) 45 MG tablet Take 45 mg by mouth at bedtime.    Historical Provider, MD  olmesartan (BENICAR) 20 MG tablet Take 20-40 mg by mouth daily.    Historical Provider, MD  Omeprazole (PRILOSEC PO) Take 20 mg by mouth 2 (two) times daily.     Historical Provider, MD  ondansetron (ZOFRAN) 4 MG tablet Take 4 mg by mouth every 8 (eight) hours as needed for nausea or vomiting.    Historical Provider, MD  oxyCODONE (ROXICODONE) 15 MG immediate release tablet Take 15 mg by mouth every 6 (six) hours as needed for pain.     Historical Provider, MD  pregabalin (LYRICA) 150 MG capsule Take 150 mg by mouth 3 (three) times daily.     Historical Provider, MD  propranolol (INDERAL) 40 MG tablet Take 80 mg by mouth 2 (two) times daily.     Historical Provider, MD  STRATTERA 100 MG capsule Take 1 capsule by mouth daily. 10/19/13   Historical Provider, MD  UNABLE TO FIND 45 mLs 2 (two) times a week. Med Name: Testosterone Cypiate    Historical Provider, MD  UNABLE TO FIND Take 420 mg by mouth 2 (two) times daily. Med Name: Nitric oxide    Historical Provider, MD  VOLTAREN 1 % GEL Apply 2 g topically 2 (two) times daily as needed.  06/12/12   Historical Provider, MD   BP 114/90 mmHg  Pulse 50  Temp(Src) 98.7 F (37.1 C) (Oral)  Resp 18  Ht 5' 11.5" (1.816 m)  Wt 200 lb (90.719 kg)  BMI 27.51 kg/m2  SpO2 97%   Physical Exam  Nursing note and vitals reviewed. General: Well-developed, well-nourished male in no acute distress; appearance consistent with age of record HENT: normocephalic; atraumatic Eyes: pupils equal, round and reactive to light; extraocular muscles intact Neck: supple Heart: regular rate and rhythm Lungs: clear to auscultation bilaterally Abdomen: soft; nondistended; nontender; no masses or hepatosplenomegaly; bowel sounds present Extremities: No deformity; full range of motion; pulses normal; edema of the left lower leg and ankle with some posterior tenderness of the left  calf Neurologic: Awake, alert and oriented; motor function intact in all extremities and symmetric; no facial droop Skin: Warm and dry Psychiatric: Normal mood and affect  ED Course  Procedures (including critical care time)  DIAGNOSTIC STUDIES: Oxygen Saturation is 97% on RA, normal by my interpretation.    COORDINATION OF CARE: 11:45 PM-Discussed treatment plan which includes RICE therapy with pt at bedside and pt agreed to plan.   MDM  Nursing notes and vitals signs,  including pulse oximetry, reviewed.  Summary of this visit's results, reviewed by myself:  Imaging Studies: US Venous Img Lower Unilateral Left  06/01/2015  CLINICAL DATA:  Acute onset of left lower extremity swelling. Initial encounter. EXAM: LEFT LOWER EXTREMITY VENOUS DOPPLER ULTRASOUND TECHNIQUE: Gray-scale sonography with graded compression, as well as color Doppler and duplex ultrasound were performed to evaluate the lower extremity deep venous systems from the level of the common femoral vein and including the common femoral, femoral, profunda femoral, popliteal and calf veins including the posterior tibial, peroneal and gastrocnemius veins when visible. The superficial great saphenous vein was also interrogated. Spectral Doppler was utilized to evaluate flow at rest and with distal augmentation maneuvers in the common femoral, femoral and popliteal veins. COMPARISON:  None. FINDINGS: Contralateral Common Femoral Vein: Respiratory phasicity is normal and symmetric with the symptomatic side. No evidence of thrombus. Normal compressibility. Common Femoral Vein: No evidence of thrombus. Normal compressibility, respiratory phasicity and response to augmentation. Saphenofemoral Junction: No evidence of thrombus. Normal compressibility and flow on color Doppler imaging. Profunda Femoral Vein: No evidence of thrombus. Normal compressibility and flow on color Doppler imaging. Femoral Vein: No evidence of thrombus. Normal  compressibility, respiratory phasicity and response to augmentation. Popliteal Vein: No evidence of thrombus. Normal compressibility, respiratory phasicity and response to augmentation. Calf Veins: No evidence of thrombus. Normal compressibility and flow on color Doppler imaging. Superficial Great Saphenous Vein: No evidence of thrombus. Normal compressibility and flow on color Doppler imaging. Venous Reflux:  None. Other Findings:  None. IMPRESSION: No evidence of deep venous thrombosis. Electronically Signed   By: Roanna Raider M.D.   On: 06/01/2015 22:40     Final diagnoses:  Pain and swelling of left lower leg   I personally performed the services described in this documentation, which was scribed in my presence. The recorded information has been reviewed and is accurate.     [name], MD 06/01/15 2351

## 2015-06-01 NOTE — ED Notes (Signed)
Patient states that he has swelling noted to his left left. The patient reports that he is now having a sharp pain to the back of his calf. The patient

## 2015-06-01 NOTE — Discharge Instructions (Signed)
Edema °Edema is an abnormal buildup of fluids in your body tissues. Edema is somewhat dependent on gravity to pull the fluid to the lowest place in your body. That makes the condition more common in the legs and thighs (lower extremities). Painless swelling of the feet and ankles is common and becomes more likely as you get older. It is also common in looser tissues, like around your eyes.  °When the affected area is squeezed, the fluid may move out of that spot and leave a dent for a few moments. This dent is called pitting.  °CAUSES  °There are many possible causes of edema. Eating too much salt and being on your feet or sitting for a long time can cause edema in your legs and ankles. Hot weather may make edema worse. Common medical causes of edema include: °· Heart failure. °· Liver disease. °· Kidney disease. °· Weak blood vessels in your legs. °· Cancer. °· An injury. °· Pregnancy. °· Some medications. °· Obesity.  °SYMPTOMS  °Edema is usually painless. Your skin may look swollen or shiny.  °DIAGNOSIS  °Your health care provider may be able to diagnose edema by asking about your medical history and doing a physical exam. You may need to have tests such as X-rays, an electrocardiogram, or blood tests to check for medical conditions that may cause edema.  °TREATMENT  °Edema treatment depends on the cause. If you have heart, liver, or kidney disease, you need the treatment appropriate for these conditions. General treatment may include: °· Elevation of the affected body part above the level of your heart. °· Compression of the affected body part. Pressure from elastic bandages or support stockings squeezes the tissues and forces fluid back into the blood vessels. This keeps fluid from entering the tissues. °· Restriction of fluid and salt intake. °· Use of a water pill (diuretic). These medications are appropriate only for some types of edema. They pull fluid out of your body and make you urinate more often. This  gets rid of fluid and reduces swelling, but diuretics can have side effects. Only use diuretics as directed by your health care provider. °HOME CARE INSTRUCTIONS  °· Keep the affected body part above the level of your heart when you are lying down.   °· Do not sit still or stand for prolonged periods.   °· Do not put anything directly under your knees when lying down. °· Do not wear constricting clothing or garters on your upper legs.   °· Exercise your legs to work the fluid back into your blood vessels. This may help the swelling go down.   °· Wear elastic bandages or support stockings to reduce ankle swelling as directed by your health care provider.   °· Eat a low-salt diet to reduce fluid if your health care provider recommends it.   °· Only take medicines as directed by your health care provider.  °SEEK MEDICAL CARE IF:  °· Your edema is not responding to treatment. °· You have heart, liver, or kidney disease and notice symptoms of edema. °· You have edema in your legs that does not improve after elevating them.   °· You have sudden and unexplained weight gain. °SEEK IMMEDIATE MEDICAL CARE IF:  °· You develop shortness of breath or chest pain.   °· You cannot breathe when you lie down. °· You develop pain, redness, or warmth in the swollen areas.   °· You have heart, liver, or kidney disease and suddenly get edema. °· You have a fever and your symptoms suddenly get worse. °MAKE SURE YOU:  °·   Understand these instructions. °· Will watch your condition. °· Will get help right away if you are not doing well or get worse. °  °This information is not intended to replace advice given to you by your health care provider. Make sure you discuss any questions you have with your health care provider. °  °Document Released: 03/28/2005 Document Revised: 04/18/2014 Document Reviewed: 01/18/2013 °Elsevier Interactive Patient Education ©2016 [employer] ° °

## 2015-07-15 DIAGNOSIS — S86112D Strain of other muscle(s) and tendon(s) of posterior muscle group at lower leg level, left leg, subsequent encounter: Secondary | ICD-10-CM | POA: Diagnosis not present

## 2015-07-15 DIAGNOSIS — M2022 Hallux rigidus, left foot: Secondary | ICD-10-CM | POA: Diagnosis not present

## 2015-07-15 DIAGNOSIS — M2021 Hallux rigidus, right foot: Secondary | ICD-10-CM | POA: Diagnosis not present

## 2015-07-15 DIAGNOSIS — M21619 Bunion of unspecified foot: Secondary | ICD-10-CM | POA: Diagnosis not present

## 2015-07-29 ENCOUNTER — Other Ambulatory Visit: Payer: Self-pay | Admitting: Neurology

## 2015-07-29 DIAGNOSIS — F4323 Adjustment disorder with mixed anxiety and depressed mood: Secondary | ICD-10-CM | POA: Diagnosis not present

## 2015-08-05 DIAGNOSIS — M542 Cervicalgia: Secondary | ICD-10-CM | POA: Diagnosis not present

## 2015-08-05 DIAGNOSIS — G894 Chronic pain syndrome: Secondary | ICD-10-CM | POA: Diagnosis not present

## 2015-08-05 DIAGNOSIS — M545 Low back pain: Secondary | ICD-10-CM | POA: Diagnosis not present

## 2015-08-13 ENCOUNTER — Other Ambulatory Visit: Payer: Self-pay | Admitting: *Deleted

## 2015-08-13 DIAGNOSIS — I714 Abdominal aortic aneurysm, without rupture, unspecified: Secondary | ICD-10-CM

## 2015-08-17 DIAGNOSIS — R946 Abnormal results of thyroid function studies: Secondary | ICD-10-CM | POA: Diagnosis not present

## 2015-08-17 DIAGNOSIS — R61 Generalized hyperhidrosis: Secondary | ICD-10-CM | POA: Diagnosis not present

## 2015-08-17 DIAGNOSIS — R197 Diarrhea, unspecified: Secondary | ICD-10-CM | POA: Diagnosis not present

## 2015-08-17 DIAGNOSIS — R0989 Other specified symptoms and signs involving the circulatory and respiratory systems: Secondary | ICD-10-CM | POA: Diagnosis not present

## 2015-08-20 DIAGNOSIS — R197 Diarrhea, unspecified: Secondary | ICD-10-CM | POA: Diagnosis not present

## 2015-08-20 DIAGNOSIS — R61 Generalized hyperhidrosis: Secondary | ICD-10-CM | POA: Diagnosis not present

## 2015-08-20 DIAGNOSIS — R946 Abnormal results of thyroid function studies: Secondary | ICD-10-CM | POA: Diagnosis not present

## 2015-08-20 DIAGNOSIS — Z862 Personal history of diseases of the blood and blood-forming organs and certain disorders involving the immune mechanism: Secondary | ICD-10-CM | POA: Diagnosis not present

## 2015-08-20 DIAGNOSIS — E291 Testicular hypofunction: Secondary | ICD-10-CM | POA: Diagnosis not present

## 2015-08-21 ENCOUNTER — Encounter: Payer: Self-pay | Admitting: Vascular Surgery

## 2015-08-26 ENCOUNTER — Ambulatory Visit (INDEPENDENT_AMBULATORY_CARE_PROVIDER_SITE_OTHER): Payer: BLUE CROSS/BLUE SHIELD | Admitting: Family

## 2015-08-26 ENCOUNTER — Ambulatory Visit (HOSPITAL_COMMUNITY)
Admission: RE | Admit: 2015-08-26 | Discharge: 2015-08-26 | Disposition: A | Discharge status: Home / Self Care | Payer: BLUE CROSS/BLUE SHIELD | Source: Ambulatory Visit | Attending: Vascular Surgery | Admitting: Vascular Surgery

## 2015-08-26 ENCOUNTER — Encounter: Payer: Self-pay | Admitting: Family

## 2015-08-26 VITALS — BP 109/78 | HR 60 | Ht 71.5 in | Wt 189.0 lb

## 2015-08-26 DIAGNOSIS — I714 Abdominal aortic aneurysm, without rupture, unspecified: Secondary | ICD-10-CM

## 2015-08-26 DIAGNOSIS — I723 Aneurysm of iliac artery: Secondary | ICD-10-CM

## 2015-08-26 NOTE — Progress Notes (Signed)
[facility] [place]   CC: Follow up Left Common Iliac Artery Aneurysm  History of Present Illness  Charles Stevens is a 55 y.o. ([birth year]) male patient of Dr. Edilia Bo whom he saw in consultation for a left common iliac artery aneurysm on 05/22/2014. The patient was having repeated episodes of C. Difficile which prompted the CT scan and this was an incidental finding. The aneurysm was 2.5 cm in maximum diameter.   He has chronic back pain, has had 5 back surgeries, denies any new back pain. He has a recurrent cycle of constipation and diarrhea since about 2014, no pain referable to his left CIA aneurysm. He is under evaluation for spontaneous muscle tears, joint issues; pt states possible Carylon Perches' Danlos will be evaluated elsewhere, possibly in [place].   He does use nicotine gum but does not smoke. He states that his blood pressure normally runs high. He is not on medication for blood pressure. Pt was diagnosed with autonomic dysfunction the end of 2016 by his neurologist: Barrett's esophagus, tachycardia, excessive sweating, labile blood pressure.  He was a runner, his left foot has collapsed and is painful.  The patient denies claudication in legs with walking. The patient denies any known history of stroke or TIA symptoms.  Pt Diabetic: No Pt smoker: former smoker, quit in 2012, started about 1981. Is using nicotine vapor product with what he describes as medium nicotine content.   Past Medical History  Diagnosis Date  . Rheumatoid arthritis(714.0)   . Attention deficit disorder   . Memory loss   . Cervical spondylosis   . Dysuria     Hx. of Dysuria  . Chronic prostatitis     Possible chronic prostatitis  . Memory loss of unknown cause     dr [name] 2014 . NP Darrol Angel   . Barrett's esophagus   . Aneurysm (HCC)   . Foot drop, left   . Cervical disc disorder with radiculopathy of cervical region 10/09/2014    Chronic right C7 radiculopathy  . Anemia     Past Surgical History  Procedure Laterality Date  . Cervical fusion  2012, 2013 X2  and 2014  . Nose surgery      with uvulectomy   Social History Social History   Social History  . Marital Status: Married    Spouse Name: N/A  . Number of Children: 0  . Years of Education: assoc   Occupational History  . [employer]    Social History Main Topics  . Smoking status: Former Smoker    Types: Pipe  . Smokeless tobacco: Never Used     Comment: Quit in April  . Alcohol Use: No  . Drug Use: No  . Sexual Activity: Not on file   Other Topics Concern  . Not on file   Social History Narrative   Patient drinks 4 cups of caffeine daily.   Patient is right handed.    Family History Family History  Problem Relation Age of Onset  . Cancer Mother     Colon, Breast, Liver  . Varicose Veins Mother   . Hyperlipidemia Mother   . Hyperlipidemia Father   . Hypertension Father   . Heart attack Father   . Diabetes Brother   . Heart disease Brother   . Peripheral vascular disease Brother   . Deep vein thrombosis Brother   . Heart attack Brother     Current Outpatient Prescriptions on File Prior to Visit  Medication Sig  Dispense Refill  . amoxicillin-clavulanate (AUGMENTIN) 500-125 MG tablet Take 1 tablet by mouth daily.    Marland Kitchen aspirin 81 MG tablet Take 81 mg by mouth daily.    . baclofen (LIORESAL) 10 MG tablet Take 10 mg by mouth 3 (three) times daily.     Marland Kitchen buPROPion (WELLBUTRIN SR) 150 MG 12 hr tablet Take 150 mg by mouth daily.    . celecoxib (CELEBREX) 200 MG capsule Take 200 mg by mouth daily.    . Cholecalciferol (VITAMIN D3) 10000 UNITS capsule Take 10,000 Units by mouth daily.    Marland Kitchen HUMIRA PEN 40 MG/0.8ML injection Inject 40 mg into the skin every 14 (fourteen) days.     Marland Kitchen KRILL OIL PO Take 2,000 mg by mouth daily.    . mirtazapine (REMERON) 15 MG tablet TAKE 2 TABLETS (30 MG TOTAL) BY MOUTH AT BEDTIME. 60 tablet 3  . mirtazapine (REMERON) 45 MG tablet Take 45 mg by  mouth at bedtime.    Marland Kitchen olmesartan (BENICAR) 20 MG tablet Take 20-40 mg by mouth daily.    . Omeprazole (PRILOSEC PO) Take 20 mg by mouth 2 (two) times daily.     . ondansetron (ZOFRAN) 4 MG tablet Take 4 mg by mouth every 8 (eight) hours as needed for nausea or vomiting.    Marland Kitchen oxyCODONE (ROXICODONE) 15 MG immediate release tablet Take 15 mg by mouth every 6 (six) hours as needed for pain.     . pregabalin (LYRICA) 150 MG capsule Take 150 mg by mouth 3 (three) times daily.     . propranolol (INDERAL) 40 MG tablet Take 80 mg by mouth 2 (two) times daily.     Marland Kitchen UNABLE TO FIND 45 mLs 2 (two) times a week. Med Name: Testosterone Cypiate    . UNABLE TO FIND Take 420 mg by mouth 2 (two) times daily. Med Name: Nitric oxide    . VOLTAREN 1 % GEL Apply 2 g topically 2 (two) times daily as needed.     Marland Kitchen DHEA 25 MG CAPS Take 75 mg by mouth daily. Reported on 08/26/2015    . STRATTERA 100 MG capsule Take 1 capsule by mouth daily. Reported on 08/26/2015     No current facility-administered medications on file prior to visit.   No Known Allergies  ROS: See HPI for pertinent positives and negatives.  Physical Examination  Filed Vitals:   08/26/15 1110  BP: 109/78  Pulse: 60  Height: 5' 11.5" (1.816 m)  Weight: 189 lb (85.73 kg)  SpO2: 98%   Body mass index is 26 kg/(m^2).  GENERAL: The patient is a well-nourished male, in no acute distress. The vital signs are documented above. CARDIAC: There is a regular rate and rhythm.  VASCULAR: I do not detect carotid bruits. He has palpable femoral and pedal pulses bilaterally. PULMONARY: There is good air exchange bilaterally without wheezing or rales. Respirations are non labored. ABDOMEN: Soft and non-tender with normal pitched bowel sounds.  MUSCULOSKELETAL: There are no major deformities or cyanosis. His left foot has a flat arch. NEUROLOGIC: No focal weakness or paresthesias are detected. SKIN: There are no ulcers or rashes noted. PSYCHIATRIC: The  patient has a normal affect, somewhat anxious. Loquacious.    Non-Invasive Vascular Imaging  Aortoiliac Duplex (08/26/2015) ABDOMINAL AORTA DUPLEX EVALUATION    INDICATION: Evaluation of abdominal aorta and iliac aneurysm.    PREVIOUS INTERVENTION(S):     DUPLEX EXAM:     LOCATION DIAMETER AP (cm) DIAMETER TRANSVERSE (cm) VELOCITIES (  cm/sec)  Aorta Proximal 2.01 2.14   Aorta Mid 1.83 1.94 65  Aorta Distal 2.02 2.01 58  Right Common Iliac Artery 1.14 1.09 109  Left Common Iliac Artery 2.43 2.50 37    Previous max aortic diameter:  Left Common iliac artery 2.4 x 2.3 Date: 11/26/2014     ADDITIONAL FINDINGS:     IMPRESSION: Left proximal common iliac artery aneurysm noted with a maximum diameter  of 2.43 x 2.50 cm.    Compared to the previous exam:  No significant change in comparison to the last exam on 11/26/2014.     Medical Decision Making  The patient is a 55 y.o. male who presents for routine follow up of left common iliac artery aneurysm. He has chronic back pain, has had 5 back surgeries, has chronic back pain, no new back pain. He has recurrent constipation and diarrhea, no new abdominal pain. Left proximal common iliac artery aneurysm noted with a maximum diameter of 2.43 x 2.50 cm compared to 2.4 x 2.3 on 11/26/14. Maximum abdominal aorta diameter is 2.14 cm.  His blood pressure is now in good control and pt states it is no longer labile.   Based on this patient's exam and diagnostic studies, the patient will follow up in 9 months  with the following studies: bilateral aortoiliac duplex.  Consideration for repair of common iliac artery aneurysm would be made when the size is 3.5 cm, growth > 1 cm/yr, and symptomatic status.  I emphasized the importance of maximal medical management including strict control of blood pressure, blood glucose, and lipid levels, antiplatelet agents, obtaining regular exercise, and cessation of nicotine products.    The patient was  advised to call 911 should the patient experience sudden onset abdominal or back pain.   Thank you for allowing [place] to participate in this patient's care.  Charisse March, RN, MSN, FNP-C [facility] [place] Office: [phone]  Clinic Physician: Edilia Bo  08/26/2015, 11:25 AM

## 2015-09-02 DIAGNOSIS — M4712 Other spondylosis with myelopathy, cervical region: Secondary | ICD-10-CM | POA: Diagnosis not present

## 2015-09-02 DIAGNOSIS — G894 Chronic pain syndrome: Secondary | ICD-10-CM | POA: Diagnosis not present

## 2015-09-02 DIAGNOSIS — M542 Cervicalgia: Secondary | ICD-10-CM | POA: Diagnosis not present

## 2015-09-04 ENCOUNTER — Other Ambulatory Visit: Payer: Self-pay | Admitting: Orthopaedic Surgery

## 2015-09-04 DIAGNOSIS — G894 Chronic pain syndrome: Secondary | ICD-10-CM

## 2015-09-08 DIAGNOSIS — F4323 Adjustment disorder with mixed anxiety and depressed mood: Secondary | ICD-10-CM | POA: Diagnosis not present

## 2015-09-09 DIAGNOSIS — R7989 Other specified abnormal findings of blood chemistry: Secondary | ICD-10-CM | POA: Diagnosis not present

## 2015-09-14 ENCOUNTER — Other Ambulatory Visit: Payer: Self-pay | Admitting: Internal Medicine

## 2015-09-14 DIAGNOSIS — R51 Headache: Principal | ICD-10-CM

## 2015-09-14 DIAGNOSIS — R519 Headache, unspecified: Secondary | ICD-10-CM

## 2015-09-15 ENCOUNTER — Ambulatory Visit
Admission: RE | Admit: 2015-09-15 | Discharge: 2015-09-15 | Disposition: A | Discharge status: Home / Self Care | Payer: BLUE CROSS/BLUE SHIELD | Source: Ambulatory Visit | Attending: Orthopaedic Surgery | Admitting: Orthopaedic Surgery

## 2015-09-15 VITALS — BP 104/65 | HR 49

## 2015-09-15 DIAGNOSIS — G894 Chronic pain syndrome: Secondary | ICD-10-CM

## 2015-09-15 DIAGNOSIS — M5134 Other intervertebral disc degeneration, thoracic region: Secondary | ICD-10-CM | POA: Diagnosis not present

## 2015-09-15 DIAGNOSIS — M4322 Fusion of spine, cervical region: Secondary | ICD-10-CM | POA: Diagnosis not present

## 2015-09-15 DIAGNOSIS — M5010 Cervical disc disorder with radiculopathy, unspecified cervical region: Secondary | ICD-10-CM

## 2015-09-15 DIAGNOSIS — M5126 Other intervertebral disc displacement, lumbar region: Secondary | ICD-10-CM | POA: Diagnosis not present

## 2015-09-15 MED ORDER — DIAZEPAM 5 MG PO TABS
10.0000 mg | ORAL_TABLET | Freq: Once | ORAL | Status: AC
  Administered 2015-09-15: 5 mg via ORAL

## 2015-09-15 MED ORDER — IOPAMIDOL (ISOVUE-M 200) INJECTION 41%
10.0000 mL | Freq: Once | INTRAMUSCULAR | Status: AC
  Administered 2015-09-15: 10 mL via INTRATHECAL

## 2015-09-15 NOTE — Discharge Instructions (Signed)
Myelogram Discharge Instructions  1. Go home and rest quietly for the next 24 hours.  It is important to lie flat for the next 24 hours.  Get up only to go to the restroom.  You may lie in the bed or on a couch on your back, your stomach, your left side or your right side.  You may have one pillow under your head.  You may have pillows between your knees while you are on your side or under your knees while you are on your back.  2. DO NOT drive today.  Recline the seat as far back as it will go, while still wearing your seat belt, on the way home.  3. You may get up to go to the bathroom as needed.  You may sit up for 10 minutes to eat.  You may resume your normal diet and medications unless otherwise indicated.  Drink lots of extra fluids today and tomorrow.  4. The incidence of headache, nausea, or vomiting is about 5% (one in 20 patients).  If you develop a headache, lie flat and drink plenty of fluids until the headache goes away.  Caffeinated beverages may be helpful.  If you develop severe nausea and vomiting or a headache that does not go away with flat bed rest, call [phone].  5. You may resume normal activities after your 24 hours of bed rest is over; however, do not exert yourself strongly or do any heavy lifting tomorrow. If when you get up you have a headache when standing, go back to bed and force fluids for another 24 hours.  6. Call your physician for a follow-up appointment.  The results of your myelogram will be sent directly to your physician by the following day.  7. If you have any questions or if complications develop after you arrive home, please call [phone].  Discharge instructions have been explained to the patient.  The patient, or the person responsible for the patient, fully understands these instructions.       May resume Wellbutrin and Remeron on September 16, 2015, after 1:00 pm.

## 2015-09-15 NOTE — Progress Notes (Signed)
Patient states he has been off Remeron and Wellbutrin for at least the past two days.

## 2015-09-16 DIAGNOSIS — R0989 Other specified symptoms and signs involving the circulatory and respiratory systems: Secondary | ICD-10-CM | POA: Diagnosis not present

## 2015-09-16 DIAGNOSIS — I10 Essential (primary) hypertension: Secondary | ICD-10-CM | POA: Diagnosis not present

## 2015-09-16 DIAGNOSIS — F5101 Primary insomnia: Secondary | ICD-10-CM | POA: Diagnosis not present

## 2015-09-16 DIAGNOSIS — Z8349 Family history of other endocrine, nutritional and metabolic diseases: Secondary | ICD-10-CM | POA: Diagnosis not present

## 2015-09-16 DIAGNOSIS — E038 Other specified hypothyroidism: Secondary | ICD-10-CM | POA: Diagnosis not present

## 2015-09-16 DIAGNOSIS — R61 Generalized hyperhidrosis: Secondary | ICD-10-CM | POA: Diagnosis not present

## 2015-09-21 ENCOUNTER — Inpatient Hospital Stay: Admission: RE | Admit: 2015-09-21 | Payer: BLUE CROSS/BLUE SHIELD | Source: Ambulatory Visit

## 2015-09-28 ENCOUNTER — Ambulatory Visit
Admission: RE | Admit: 2015-09-28 | Discharge: 2015-09-28 | Disposition: A | Discharge status: Home / Self Care | Payer: BLUE CROSS/BLUE SHIELD | Source: Ambulatory Visit | Attending: Internal Medicine | Admitting: Internal Medicine

## 2015-09-28 DIAGNOSIS — R519 Headache, unspecified: Secondary | ICD-10-CM

## 2015-09-28 DIAGNOSIS — R51 Headache: Principal | ICD-10-CM

## 2015-09-28 MED ORDER — GADOBENATE DIMEGLUMINE 529 MG/ML IV SOLN
9.0000 mL | Freq: Once | INTRAVENOUS | Status: AC | PRN
  Administered 2015-09-28: 9 mL via INTRAVENOUS

## 2015-09-29 DIAGNOSIS — R413 Other amnesia: Secondary | ICD-10-CM | POA: Diagnosis not present

## 2015-10-01 ENCOUNTER — Ambulatory Visit: Payer: 59 | Admitting: Neurology

## 2015-10-06 DIAGNOSIS — M47816 Spondylosis without myelopathy or radiculopathy, lumbar region: Secondary | ICD-10-CM | POA: Diagnosis not present

## 2015-10-08 DIAGNOSIS — Z79899 Other long term (current) drug therapy: Secondary | ICD-10-CM | POA: Diagnosis not present

## 2015-10-08 DIAGNOSIS — G894 Chronic pain syndrome: Secondary | ICD-10-CM | POA: Diagnosis not present

## 2015-10-12 DIAGNOSIS — F4323 Adjustment disorder with mixed anxiety and depressed mood: Secondary | ICD-10-CM | POA: Diagnosis not present

## 2015-10-20 DIAGNOSIS — M791 Myalgia: Secondary | ICD-10-CM | POA: Diagnosis not present

## 2015-10-20 DIAGNOSIS — M47816 Spondylosis without myelopathy or radiculopathy, lumbar region: Secondary | ICD-10-CM | POA: Diagnosis not present

## 2015-10-26 DIAGNOSIS — F4323 Adjustment disorder with mixed anxiety and depressed mood: Secondary | ICD-10-CM | POA: Diagnosis not present

## 2015-11-05 ENCOUNTER — Telehealth: Payer: Self-pay

## 2015-11-05 ENCOUNTER — Ambulatory Visit (INDEPENDENT_AMBULATORY_CARE_PROVIDER_SITE_OTHER): Payer: BLUE CROSS/BLUE SHIELD | Admitting: Neurology

## 2015-11-05 ENCOUNTER — Encounter: Payer: Self-pay | Admitting: Neurology

## 2015-11-05 VITALS — BP 140/101 | HR 63 | Ht 71.5 in | Wt 195.5 lb

## 2015-11-05 DIAGNOSIS — R413 Other amnesia: Secondary | ICD-10-CM | POA: Diagnosis not present

## 2015-11-05 NOTE — Telephone Encounter (Signed)
Called Charles Stevens's office. Requested most recent report be faxed to [phone].

## 2015-11-05 NOTE — Telephone Encounter (Signed)
-----   Message from [name], MD sent at 11/05/2015  3:01 PM EDT ----- Please contact the office of Leavy Cella, [phone] and get the most recent neuropsychological evaluation report faxed to our office. Thank you.

## 2015-11-05 NOTE — Progress Notes (Signed)
Reason for visit: Cognitive decline  Charles Stevens is an 55 y.o. male  History of present illness:  Charles Stevens is a 55 year old right-handed white male with a history of a cervical myelopathy. The patient has reported a several year history of difficulty with cognitive functioning. He has developed a stuttering speech pattern, he indicates that he has problems with misplacing things about the house, he will go into rooms and cannot remember what he is trying to get. The patient has had some problems of autonomic instability with sweats, increased heart rate. He has been on a beta blocker which initially has been helpful. The patient is on propranolol, and he is now complaining of significant fatigue during the day. The patient believes that his cognitive issues, difficulty with coordination with walking and performing tasks with the arms has worsened over time. The patient has recently undergone another MRI of the brain which is normal. The patient has had recent neuropsychological evaluation through Charles Stevens, the results are not available to me. He has had neuropsychological evaluation over a year ago as well. The patient comes back to this office for further evaluation. The patient is on Focalin, but he believes that Strattera worked better for his cognitive issues previously. He claims that he is now on long-term disability.  Past Medical History:  Diagnosis Date  . Anemia   . Aneurysm (HCC)   . Attention deficit disorder   . Barrett's esophagus   . Cervical disc disorder with radiculopathy of cervical region 10/09/2014   Chronic right C7 radiculopathy  . Cervical spondylosis   . Chronic prostatitis    Possible chronic prostatitis  . Dysuria    Hx. of Dysuria  . Foot drop, left   . Memory loss   . Memory loss of unknown cause    dr [name] 2014 . NP Darrol Angel   . Rheumatoid arthritis(714.0)     Past Surgical History:  Procedure Laterality Date  . CERVICAL FUSION  2012,  2013 X2  and 2014  . NOSE SURGERY     with uvulectomy    Family History  Problem Relation Age of Onset  . Cancer Mother     Colon, Breast, Liver  . Varicose Veins Mother   . Hyperlipidemia Mother   . Hyperlipidemia Father   . Hypertension Father   . Heart attack Father   . Diabetes Brother   . Heart disease Brother   . Peripheral vascular disease Brother   . Deep vein thrombosis Brother   . Heart attack Brother     Social history:  reports that he has quit smoking. His smoking use included Pipe. He has never used smokeless tobacco. He reports that he does not drink alcohol or use drugs.   No Known Allergies  Medications:  Prior to Admission medications   Medication Sig Start Date End Date Taking? Authorizing Provider  amoxicillin-clavulanate (AUGMENTIN) 500-125 MG tablet Take 1 tablet by mouth daily.   Yes Historical Provider, MD  aspirin 81 MG tablet Take 81 mg by mouth daily.   Yes Historical Provider, MD  baclofen (LIORESAL) 10 MG tablet Take 10 mg by mouth 3 (three) times daily.    Yes Historical Provider, MD  buPROPion (WELLBUTRIN SR) 150 MG 12 hr tablet Take 150 mg by mouth daily.   Yes Historical Provider, MD  celecoxib (CELEBREX) 200 MG capsule Take 200 mg by mouth daily.   Yes Historical Provider, MD  dexmethylphenidate (FOCALIN) 10 MG tablet Take 10 mg  by mouth daily.   Yes Historical Provider, MD  HUMIRA PEN 40 MG/0.8ML injection Inject 40 mg into the skin every 14 (fourteen) days.  07/23/12  Yes Historical Provider, MD  mirtazapine (REMERON) 45 MG tablet Take 45 mg by mouth at bedtime.   Yes Historical Provider, MD  Omeprazole (PRILOSEC PO) Take 20 mg by mouth 2 (two) times daily.    Yes Historical Provider, MD  ondansetron (ZOFRAN) 4 MG tablet Take 4 mg by mouth every 8 (eight) hours as needed for nausea or vomiting.   Yes Historical Provider, MD  oxyCODONE (ROXICODONE) 15 MG immediate release tablet Take 15 mg by mouth every 6 (six) hours as needed for pain.    Yes  Historical Provider, MD  pramipexole (MIRAPEX) 0.125 MG tablet  08/15/15  Yes Historical Provider, MD  pregabalin (LYRICA) 150 MG capsule Take 150 mg by mouth 3 (three) times daily.    Yes Historical Provider, MD  propranolol (INDERAL) 40 MG tablet Take 80 mg by mouth 2 (two) times daily.    Yes Historical Provider, MD  UNABLE TO FIND Take 420 mg by mouth 2 (two) times daily. Med Name: Nitric oxide   Yes Historical Provider, MD  VOLTAREN 1 % GEL Apply 2 g topically 2 (two) times daily as needed.  06/12/12  Yes Historical Provider, MD  Cholecalciferol (VITAMIN D3) 10000 UNITS capsule Take 10,000 Units by mouth daily.    Historical Provider, MD    ROS:  Out of a complete 14 system review of symptoms, the patient complains only of the following symptoms, and all other reviewed systems are negative.  Decreased activity, fatigue, excessive sweating Drooling Eye redness, blurred vision Leg swelling, palpitations of the heart Cold and heat intolerance Abdominal pain, black stools, constipation, diarrhea, nausea, vomiting Restless legs, daytime sleepiness, snoring, acting out dreams Incontinence of the bladder, urinary frequency Joint pain, back pain, achy muscles, muscle cramps, walking difficulty, neck pain, neck stiffness Bruising easily, anemia Memory loss, dizziness, headache, numbness, speech difficulty, weakness, passing out Agitation, confusion, decreased concentration, anxiety  Blood pressure (!) 140/101, pulse 63, height 5' 11.5" (1.816 m), weight 195 lb 8 oz (88.7 kg).  Physical Exam  General: The patient is alert and cooperative at the time of the examination.  Skin: No significant peripheral edema is noted.   Neurologic Exam  Mental status: The patient is alert and oriented x 3 at the time of the examination. The patient has apparent normal recent and remote memory, with an apparently normal attention span and concentration ability. Mini-Mental Status Examination done today  shows a total score of 30/30.   Cranial nerves: Facial symmetry is present. Speech is hesitant, with a stuttering speech pattern, no aphasia. Extraocular movements are full. Visual fields are full.  Motor: The patient has good strength in all 4 extremities.  Sensory examination: Soft touch sensation is symmetric on the face, arms, and legs.  Coordination: The patient has good finger-nose-finger and heel-to-shin bilaterally.  Gait and station: The patient has a normal gait. Tandem gait is slightly unsteady. Romberg is negative. No drift is seen.  Reflexes: Deep tendon reflexes are symmetric.    MRI brain 09/28/15:  IMPRESSION: No significant abnormality  Normal pituitary.  * MRI scan images were reviewed online. I agree with the written report.    Assessment/Plan:  1. Reported cognitive decline  2. Stuttering speech pattern  3. Chronic fatigue  4. Cervical myelopathy  The patient is having a lot of fatigue on propranolol, he may  be able to improve his fatigue by switching to a selective beta blocker such as Toprol. The patient may contact his primary care doctor concerning this. The patient has recently had neuropsychological evaluation, we will try to get the results of this. The patient had a recent MRI of the brain that was normal. He will follow-up in 6 months. Blood work will be done today.  Marlan Palau MD 11/05/2015 2:55 PM  [facility] [address] [place], [place] [address]  Phone [phone] Fax [phone]

## 2015-11-07 LAB — ANGIOTENSIN CONVERTING ENZYME: Angio Convert Enzyme: <15 U/L (ref 14–82)

## 2015-11-07 LAB — HIV ANTIBODY (ROUTINE TESTING W REFLEX): HIV SCREEN 4TH GENERATION: NONREACTIVE

## 2015-11-07 LAB — ANA W/REFLEX: Anti Nuclear Antibody(ANA): NEGATIVE

## 2015-11-07 LAB — B. BURGDORFI ANTIBODIES: Lyme IgG/IgM Ab: <0.91 {ISR} (ref 0.00–0.90)

## 2015-11-07 LAB — SEDIMENTATION RATE: Sed Rate: 2 mm/hr (ref 0–30)

## 2015-11-07 LAB — COPPER, SERUM: Copper: 87 ug/dL (ref 72–166)

## 2015-11-09 ENCOUNTER — Telehealth: Payer: Self-pay | Admitting: Neurology

## 2015-11-09 DIAGNOSIS — F4323 Adjustment disorder with mixed anxiety and depressed mood: Secondary | ICD-10-CM | POA: Diagnosis not present

## 2015-11-09 NOTE — Telephone Encounter (Signed)
This patient has had a neuropsychological evaluation that was done on 09/29/2015. The patient had functioning at a mild neurocognitive disorder level with some impairment of executive functioning , no real memory disorder was noted. This appears to be stable from prior studies. It is possible that depression, anxiety, like stress may be creating some of the cognitive deficits. The study was felt to be inconsistent with Alzheimer's disease.   Possibly referral to speech therapy for cognitive training may be of some benefit.

## 2015-11-10 ENCOUNTER — Other Ambulatory Visit: Payer: Self-pay | Admitting: Vascular Surgery

## 2015-11-10 DIAGNOSIS — I723 Aneurysm of iliac artery: Secondary | ICD-10-CM

## 2015-11-18 DIAGNOSIS — E038 Other specified hypothyroidism: Secondary | ICD-10-CM | POA: Diagnosis not present

## 2015-11-19 ENCOUNTER — Encounter (HOSPITAL_COMMUNITY): Payer: Self-pay | Admitting: Emergency Medicine

## 2015-11-19 ENCOUNTER — Emergency Department (HOSPITAL_COMMUNITY): Payer: BLUE CROSS/BLUE SHIELD

## 2015-11-19 ENCOUNTER — Emergency Department (HOSPITAL_COMMUNITY)
Admission: EM | Admit: 2015-11-19 | Discharge: 2015-11-19 | Disposition: A | Discharge status: Home / Self Care | Payer: BLUE CROSS/BLUE SHIELD | Attending: Emergency Medicine | Admitting: Emergency Medicine

## 2015-11-19 DIAGNOSIS — R918 Other nonspecific abnormal finding of lung field: Secondary | ICD-10-CM | POA: Diagnosis not present

## 2015-11-19 DIAGNOSIS — M542 Cervicalgia: Secondary | ICD-10-CM | POA: Diagnosis not present

## 2015-11-19 DIAGNOSIS — R519 Headache, unspecified: Secondary | ICD-10-CM

## 2015-11-19 DIAGNOSIS — R51 Headache: Secondary | ICD-10-CM | POA: Diagnosis not present

## 2015-11-19 DIAGNOSIS — Z87891 Personal history of nicotine dependence: Secondary | ICD-10-CM | POA: Insufficient documentation

## 2015-11-19 DIAGNOSIS — J189 Pneumonia, unspecified organism: Secondary | ICD-10-CM | POA: Insufficient documentation

## 2015-11-19 LAB — COMPREHENSIVE METABOLIC PANEL
ALBUMIN: 3.6 g/dL (ref 3.5–5.0)
ALK PHOS: 59 U/L (ref 38–126)
ALT: 26 U/L (ref 17–63)
AST: 27 U/L (ref 15–41)
Anion gap: 6 (ref 5–15)
BUN: 21 mg/dL — ABNORMAL HIGH (ref 6–20)
CALCIUM: 9.8 mg/dL (ref 8.9–10.3)
CHLORIDE: 105 mmol/L (ref 101–111)
CO2: 27 mmol/L (ref 22–32)
CREATININE: 1.19 mg/dL (ref 0.61–1.24)
GFR calc Af Amer: >60 mL/min (ref >60)
GFR calc non Af Amer: >60 mL/min (ref >60)
GLUCOSE: 116 mg/dL — AB (ref 65–99)
Potassium: 4.4 mmol/L (ref 3.5–5.1)
SODIUM: 138 mmol/L (ref 135–145)
Total Bilirubin: 0.8 mg/dL (ref 0.3–1.2)
Total Protein: 6.5 g/dL (ref 6.5–8.1)

## 2015-11-19 LAB — CBC WITH DIFFERENTIAL/PLATELET
BASOS PCT: 1 %
Basophils Absolute: 0 10*3/uL (ref 0.0–0.1)
EOS ABS: 0.4 10*3/uL (ref 0.0–0.7)
Eosinophils Relative: 6 %
HCT: 44.4 % (ref 39.0–52.0)
Hemoglobin: 15.4 g/dL (ref 13.0–17.0)
LYMPHS ABS: 2.8 10*3/uL (ref 0.7–4.0)
Lymphocytes Relative: 40 %
MCH: 32.3 pg (ref 26.0–34.0)
MCHC: 34.7 g/dL (ref 30.0–36.0)
MCV: 93.1 fL (ref 78.0–100.0)
MONO ABS: 0.9 10*3/uL (ref 0.1–1.0)
MONOS PCT: 13 %
Neutro Abs: 3 10*3/uL (ref 1.7–7.7)
Neutrophils Relative %: 42 %
Platelets: 235 10*3/uL (ref 150–400)
RBC: 4.77 MIL/uL (ref 4.22–5.81)
RDW: 14.2 % (ref 11.5–15.5)
WBC: 7.1 10*3/uL (ref 4.0–10.5)

## 2015-11-19 LAB — URINALYSIS, ROUTINE W REFLEX MICROSCOPIC
BILIRUBIN URINE: NEGATIVE
Glucose, UA: NEGATIVE mg/dL
HGB URINE DIPSTICK: NEGATIVE
KETONES UR: NEGATIVE mg/dL
Leukocytes, UA: NEGATIVE
NITRITE: NEGATIVE
PH: 5 (ref 5.0–8.0)
Protein, ur: NEGATIVE mg/dL
Specific Gravity, Urine: 1.019 (ref 1.005–1.030)

## 2015-11-19 LAB — CSF CELL COUNT WITH DIFFERENTIAL
RBC COUNT CSF: 192 /mm3 — AB
Tube #: 4
WBC CSF: 1 /mm3 (ref 0–5)

## 2015-11-19 LAB — GLUCOSE, CSF: GLUCOSE CSF: 62 mg/dL (ref 40–70)

## 2015-11-19 LAB — PROTEIN, CSF: Total  Protein, CSF: 62 mg/dL — ABNORMAL HIGH (ref 15–45)

## 2015-11-19 LAB — TROPONIN I

## 2015-11-19 MED ORDER — DIPHENHYDRAMINE HCL 50 MG/ML IJ SOLN
25.0000 mg | Freq: Once | INTRAMUSCULAR | Status: AC
  Administered 2015-11-19: 25 mg via INTRAVENOUS
  Filled 2015-11-19: qty 1

## 2015-11-19 MED ORDER — SODIUM CHLORIDE 0.9 % IV BOLUS (SEPSIS)
1000.0000 mL | Freq: Once | INTRAVENOUS | Status: AC
  Administered 2015-11-19: 1000 mL via INTRAVENOUS

## 2015-11-19 MED ORDER — AZITHROMYCIN 250 MG PO TABS: 250.0000 mg | ORAL_TABLET | Freq: Every day | ORAL | 0 refills | Status: DC

## 2015-11-19 MED ORDER — AZITHROMYCIN 250 MG PO TABS
500.0000 mg | ORAL_TABLET | Freq: Once | ORAL | Status: AC
Start: 2015-11-19 — End: 2015-11-19
  Administered 2015-11-19: 500 mg via ORAL
  Filled 2015-11-19: qty 2

## 2015-11-19 MED ORDER — METOCLOPRAMIDE HCL 5 MG/ML IJ SOLN
10.0000 mg | Freq: Once | INTRAMUSCULAR | Status: AC
  Administered 2015-11-19: 10 mg via INTRAVENOUS
  Filled 2015-11-19: qty 2

## 2015-11-19 MED ORDER — LIDOCAINE HCL (PF) 1 % IJ SOLN
5.0000 mL | Freq: Once | INTRAMUSCULAR | Status: AC
  Administered 2015-11-19: 5 mL
  Filled 2015-11-19: qty 30

## 2015-11-19 MED ORDER — OXYCODONE HCL 5 MG PO TABS
15.0000 mg | ORAL_TABLET | Freq: Once | ORAL | Status: AC
  Administered 2015-11-19: 15 mg via ORAL
  Filled 2015-11-19: qty 3

## 2015-11-19 NOTE — ED Notes (Signed)
Have made PT aware of urine sample.

## 2015-11-19 NOTE — ED Provider Notes (Signed)
[facility]-EMERGENCY DEPT Provider Note   CSN: [id number] Arrival date & time: 11/19/15  8338  First Provider Contact:  First MD Initiated Contact with Patient 11/19/15 [phone]        History   Chief Complaint Chief Complaint  Patient presents with  . Neck Pain  . Migraine  . Nausea    HPI Charles Stevens is a 55 y.o. male.  The history is provided by the patient. No language interpreter was used.  Neck Pain    Migraine    Charles Stevens is a 55 y.o. male who presents to the Emergency Department complaining of headache.  He presents due to concern for recent heart attack and meningitis. He states that 2 weeks ago he woke up with burning chest pain and thinks he had a heart attack at that time. He also had 2 weeks ago had subjective fevers, night sweats, headache, neck stiffness. He feels like this was meningitis and it has improved. He endorses ongoing dyspnea on exertion and generalized weakness. Over the last week he's had gradual return of his headache and neck stiffness and continues to feel poorly. No additional fevers. Patient is a very vague  historian. He saw his PCP yesterday who referred him to the emergency department for further evaluation. He does receive spinal steroid injections for chronic back pain, he received 3 over the last 5 weeks.  Past Medical History:  Diagnosis Date  . Anemia   . Aneurysm (HCC)   . Attention deficit disorder   . Barrett's esophagus   . Cervical disc disorder with radiculopathy of cervical region 10/09/2014   Chronic right C7 radiculopathy  . Cervical spondylosis   . Chronic prostatitis    Possible chronic prostatitis  . Dysuria    Hx. of Dysuria  . Foot drop, left   . Memory loss   . Memory loss of unknown cause    dr [name] 2014 . NP Darrol Angel   . Rheumatoid arthritis(714.0)     Patient Active Problem List   Diagnosis Date Noted  . Cervical disc disorder with radiculopathy of cervical region 10/09/2014  . Hypersomnia, persistent  08/31/2012  . Memory loss of unknown cause   . Encounter for therapeutic drug monitoring 04/18/2012  . Memory loss 04/18/2012  . Aphasia 04/18/2012    Past Surgical History:  Procedure Laterality Date  . CERVICAL FUSION  2012, 2013 X2  and 2014  . NOSE SURGERY     with uvulectomy       Home Medications    Prior to Admission medications   Medication Sig Start Date End Date Taking? Authorizing Provider  amoxicillin-clavulanate (AUGMENTIN) 500-125 MG tablet Take 1 tablet by mouth daily.   Yes Historical Provider, MD  aspirin 81 MG chewable tablet Chew 81 mg by mouth daily.   Yes Historical Provider, MD  baclofen (LIORESAL) 10 MG tablet Take 10-20 mg by mouth 3 (three) times daily.    Yes Historical Provider, MD  buPROPion (WELLBUTRIN SR) 100 MG 12 hr tablet Take 150 mg by mouth daily with breakfast.   Yes Historical Provider, MD  celecoxib (CELEBREX) 200 MG capsule Take 200 mg by mouth daily.   Yes Historical Provider, MD  dexmethylphenidate (FOCALIN) 10 MG tablet Take 10 mg by mouth daily.   Yes Historical Provider, MD  HUMIRA PEN 40 MG/0.8ML injection Inject 40 mg into the skin every 14 (fourteen) days.  07/23/12  Yes Historical Provider, MD  levothyroxine (SYNTHROID, LEVOTHROID) 25 MCG tablet Take 25  mcg by mouth daily before breakfast.   Yes Historical Provider, MD  lisinopril (PRINIVIL,ZESTRIL) 20 MG tablet Take 20 mg by mouth daily.   Yes Historical Provider, MD  mirtazapine (REMERON) 45 MG tablet Take 45 mg by mouth at bedtime.   Yes Historical Provider, MD  omeprazole (PRILOSEC OTC) 20 MG tablet Take 20 mg by mouth 2 (two) times daily.   Yes Historical Provider, MD  ondansetron (ZOFRAN) 4 MG tablet Take 4 mg by mouth 3 (three) times daily as needed for nausea or vomiting.    Yes Historical Provider, MD  oxyCODONE (ROXICODONE) 15 MG immediate release tablet Take 15 mg by mouth every 6 (six) hours as needed for pain.    Yes Historical Provider, MD  Phenylephrine-APAP-Guaifenesin  (MUCINEX SINUS-MAX PO) Take 2 tablets by mouth once.   Yes Historical Provider, MD  pramipexole (MIRAPEX) 0.25 MG tablet Take 0.75 mg by mouth at bedtime.   Yes Historical Provider, MD  pregabalin (LYRICA) 150 MG capsule Take 150 mg by mouth 3 (three) times daily.    Yes Historical Provider, MD  PRESCRIPTION MEDICATION Lumbar Injections at Dr's office   Yes Historical Provider, MD  propranolol (INDERAL) 40 MG tablet Take 80 mg by mouth 2 (two) times daily.    Yes Historical Provider, MD  VOLTAREN 1 % GEL Apply 2 g topically 2 (two) times daily as needed (for pain).  06/12/12  Yes Historical Provider, MD  azithromycin (ZITHROMAX) 250 MG tablet Take 1 tablet (250 mg total) by mouth daily. 11/19/15   Tilden Fossa, MD    Family History Family History  Problem Relation Age of Onset  . Cancer Mother     Colon, Breast, Liver  . Varicose Veins Mother   . Hyperlipidemia Mother   . Hyperlipidemia Father   . Hypertension Father   . Heart attack Father   . Diabetes Brother   . Heart disease Brother   . Peripheral vascular disease Brother   . Deep vein thrombosis Brother   . Heart attack Brother     Social History Social History  Substance Use Topics  . Smoking status: Former Smoker    Types: Pipe  . Smokeless tobacco: Never Used     Comment: Quit in April  . Alcohol use No     Allergies   Review of patient's allergies indicates no known allergies.   Review of Systems Review of Systems  Musculoskeletal: Positive for neck pain.  All other systems reviewed and are negative.    Physical Exam Updated Vital Signs BP 135/94   Pulse (!) 53   Temp 97.4 F (36.3 Charles) (Oral)   Resp 16   Ht 6' (1.829 m)   Wt 190 lb (86.2 kg)   SpO2 98%   BMI 25.77 kg/m   Physical Exam  Constitutional: He is oriented to person, place, and time. He appears well-developed and well-nourished.  HENT:  Head: Normocephalic and atraumatic.  Eyes: EOM are normal. Pupils are equal, round, and reactive to  light.  Neck: Neck supple.  Cardiovascular: Normal rate and regular rhythm.   No murmur heard. Pulmonary/Chest: Effort normal and breath sounds normal. No respiratory distress.  Abdominal: Soft. There is no tenderness. There is no rebound and no guarding.  Musculoskeletal: He exhibits no edema or tenderness.  Neurological: He is alert and oriented to person, place, and time. No cranial nerve deficit.  5/5 strength in all four extremities.   Skin: Skin is warm and dry.  Psychiatric: He has a  normal mood and affect. His behavior is normal.  Nursing note and vitals reviewed.    ED Treatments / Results  Labs (all labs ordered are listed, but only abnormal results are displayed) Labs Reviewed  COMPREHENSIVE METABOLIC PANEL - Abnormal; Notable for the following:       Result Value   Glucose, Bld 116 (*)    BUN 21 (*)    All other components within normal limits  PROTEIN, CSF - Abnormal; Notable for the following:    Total  Protein, CSF 62 (*)    All other components within normal limits  CSF CELL COUNT WITH DIFFERENTIAL - Abnormal; Notable for the following:    RBC Count, CSF 192 (*)    All other components within normal limits  CSF CULTURE  FUNGUS CULTURE WITH STAIN (NOT AT [facility])  CBC WITH DIFFERENTIAL/PLATELET  URINALYSIS, ROUTINE W REFLEX MICROSCOPIC (NOT AT [facility])  TROPONIN I  GLUCOSE, CSF  VDRL, CSF  CYTOLOGY - NON PAP    EKG  EKG Interpretation  Date/Time:  Thursday November 19 2015 09:18:01 EDT Ventricular Rate:  57 PR Interval:    QRS Duration: 108 QT Interval:  405 QTC Calculation: 395 R Axis:   -6 Text Interpretation:  Sinus rhythm Prolonged PR interval Baseline wander in lead(s) V6 Artifact Confirmed by Lincoln Brigham [phone]) on 11/19/2015 10:19:59 AM       Radiology Dg Chest 2 View  Result Date: 11/19/2015 CLINICAL DATA:  Headache and neck stiffness EXAM: CHEST  2 VIEW COMPARISON:  04/07/2015 FINDINGS: Cardiac shadow is within normal limits. Extensive  postsurgical changes are noted in the cervical spine. The lungs are well aerated bilaterally. Minimal changes are noted in the lingula consistent with early infiltrate. No bony abnormality is noted. IMPRESSION: Mild lingular infiltrate. Electronically Signed   By: Alcide Clever M.D.   On: 11/19/2015 09:37   Ct Head Wo Contrast  Result Date: 11/19/2015 CLINICAL DATA:  Headaches and neck stiffness EXAM: CT HEAD WITHOUT CONTRAST TECHNIQUE: Contiguous axial images were obtained from the base of the skull through the vertex without intravenous contrast. COMPARISON:  09/28/2015 FINDINGS: The bony calvarium is intact. The ventricles are of normal size and configuration. No findings to suggest acute hemorrhage, acute infarction or space-occupying mass lesion are noted. IMPRESSION: No acute intracranial abnormality noted. Electronically Signed   By: Alcide Clever M.D.   On: 11/19/2015 09:45   Dg Lumbar Puncture Fluoro Guide  Result Date: 11/19/2015 CLINICAL DATA:  Headache, neck stiffness and severe fatigue. EXAM: DIAGNOSTIC LUMBAR PUNCTURE UNDER FLUOROSCOPIC GUIDANCE FLUOROSCOPY TIME:  Radiation Exposure Index (as provided by the fluoroscopic device): 14.14 mGy If the device does not provide the exposure index: Fluoroscopy Time (in minutes and seconds):  0 minutes and 37 seconds Number of Acquired Images: PROCEDURE: Informed consent was obtained from the patient prior to the procedure, including potential complications of headache, allergy, and pain. With the patient prone, the lower back was prepped with Betadine. 1% Lidocaine was used for local anesthesia. Lumbar puncture was performed at the L3 level using a 20 gauge needle with return of clear CSF. 5 ml of CSF were obtained for laboratory studies. The patient tolerated the procedure well and there were no apparent complications. IMPRESSION: Fluoroscopic guided lumbar puncture with 5 cc of clear CSF sent for appropriate laboratory evaluation. Electronically Signed    By: Rudie Meyer M.D.   On: 11/19/2015 15:03    Procedures .Lumbar Puncture Date/Time: 11/19/2015 3:37 PM Performed by: Tilden Fossa Authorized by:  Chetara Kropp   Consent:    Consent obtained:  Verbal   Consent given by:  Patient   Risks discussed:  Bleeding, infection, headache, nerve damage, pain and repeat procedure   Alternatives discussed:  Observation Pre-procedure details:    Procedure purpose:  Diagnostic   Preparation: Patient was prepped and draped in usual sterile fashion   Anesthesia (see MAR for exact dosages):    Anesthesia method:  Local infiltration   Local anesthetic:  Lidocaine 1% w/o epi Procedure details:    Lumbar space:  L3-L4 interspace   Patient position:  R lateral decubitus   Needle gauge:  22   Needle length (in):  3.5   Number of attempts:  2 Post-procedure:    Puncture site:  Adhesive bandage applied   Patient tolerance of procedure:  Tolerated well, no immediate complications Comments:     Unable to obtain CSF. Patient was referred to radiology for fluoroscopic guided lumbar puncture.   Medications Ordered in ED Medications  metoCLOPramide (REGLAN) injection 10 mg (10 mg Intravenous Given 11/19/15 1056)  diphenhydrAMINE (BENADRYL) injection 25 mg (25 mg Intravenous Given 11/19/15 1056)  sodium chloride 0.9 % bolus 1,000 mL (0 mLs Intravenous Stopped 11/19/15 1155)  oxyCODONE (Oxy IR/ROXICODONE) immediate release tablet 15 mg (15 mg Oral Given 11/19/15 1056)  azithromycin (ZITHROMAX) tablet 500 mg (500 mg Oral Given 11/19/15 1056)  lidocaine (PF) (XYLOCAINE) 1 % injection 5 mL (5 mLs Infiltration Given by Other 11/19/15 1155)     Initial Impression / Assessment and Plan / ED Course  I have reviewed the triage vital signs and the nursing notes.  Pertinent labs & imaging results that were available during my care of the patient were reviewed by me and considered in my medical decision making (see chart for details).  Clinical Course     Patient with history of chronic pain, rheumatoid arthritis on immunosuppression here for headache, stiff neck, shortness of breath, chest pain. He is concerned for potential viral or fungal meningitis given his immunosuppression and recent spinal injections. He is nontoxic appearing on examination with no focal neurologic deficits and an distress. Attempted LP but unsuccessful at bedside and patient was referred to radiology for fluoroscopic-guided LP. CSF was not consistent with meningitis. Chest x-ray with lingular infiltrate concerning for pneumonia, given patient's symptoms will treat for community acquired pneumonia. Discussed with patient the importance of PCP as well as neurology follow-up for further evaluation of his symptoms as well as home care and return precautions.  Presentation is not consistent with ACS, PE.  Final Clinical Impressions(s) / ED Diagnoses   Final diagnoses:  CAP (community acquired pneumonia)  Bad headache    New Prescriptions Discharge Medication List as of 11/19/2015  3:27 PM    START taking these medications   Details  azithromycin (ZITHROMAX) 250 MG tablet Take 1 tablet (250 mg total) by mouth daily., Starting Thu 11/19/2015, Print         Tilden Fossa, MD 11/19/15 [phone]

## 2015-11-19 NOTE — ED Triage Notes (Addendum)
Pt presents stating "I am concerned that I may have meningitis, because I have had multiple spine puncters done the last one being 3 weeks ago." Today reports ongoing headache, neck stiffness and fever for 4 weeks. Pt is alert oriented and in NAD at triage. Has taken zofran chronic pain meds.

## 2015-11-19 NOTE — ED Provider Notes (Signed)
Noted Gram stain CSF result called at 16:30. No change in treatment at this time.   Mancel Bale, MD 11/19/15 1800

## 2015-11-20 LAB — VDRL, CSF: SYPHILIS VDRL QUANT CSF: NONREACTIVE

## 2015-11-23 DIAGNOSIS — F4323 Adjustment disorder with mixed anxiety and depressed mood: Secondary | ICD-10-CM | POA: Diagnosis not present

## 2015-11-23 LAB — CSF CULTURE W GRAM STAIN: Culture: NO GROWTH

## 2015-11-23 LAB — CSF CULTURE: GRAM STAIN: NONE SEEN

## 2015-11-25 DIAGNOSIS — E039 Hypothyroidism, unspecified: Secondary | ICD-10-CM | POA: Diagnosis not present

## 2015-11-25 DIAGNOSIS — M791 Myalgia: Secondary | ICD-10-CM | POA: Diagnosis not present

## 2015-11-25 DIAGNOSIS — G894 Chronic pain syndrome: Secondary | ICD-10-CM | POA: Diagnosis not present

## 2015-11-25 DIAGNOSIS — R938 Abnormal findings on diagnostic imaging of other specified body structures: Secondary | ICD-10-CM | POA: Diagnosis not present

## 2015-11-25 DIAGNOSIS — Z6826 Body mass index (BMI) 26.0-26.9, adult: Secondary | ICD-10-CM | POA: Diagnosis not present

## 2015-11-25 DIAGNOSIS — G909 Disorder of the autonomic nervous system, unspecified: Secondary | ICD-10-CM | POA: Diagnosis not present

## 2015-11-27 DIAGNOSIS — J180 Bronchopneumonia, unspecified organism: Secondary | ICD-10-CM | POA: Diagnosis not present

## 2015-12-16 DIAGNOSIS — M0589 Other rheumatoid arthritis with rheumatoid factor of multiple sites: Secondary | ICD-10-CM | POA: Diagnosis not present

## 2015-12-16 DIAGNOSIS — M5030 Other cervical disc degeneration, unspecified cervical region: Secondary | ICD-10-CM | POA: Diagnosis not present

## 2015-12-16 DIAGNOSIS — J189 Pneumonia, unspecified organism: Secondary | ICD-10-CM | POA: Diagnosis not present

## 2015-12-17 LAB — FUNGUS CULTURE WITH STAIN

## 2015-12-17 LAB — FUNGUS CULTURE RESULT

## 2015-12-17 LAB — FUNGAL ORGANISM REFLEX

## 2015-12-21 DIAGNOSIS — F4323 Adjustment disorder with mixed anxiety and depressed mood: Secondary | ICD-10-CM | POA: Diagnosis not present

## 2015-12-23 DIAGNOSIS — M545 Low back pain: Secondary | ICD-10-CM | POA: Diagnosis not present

## 2015-12-23 DIAGNOSIS — G894 Chronic pain syndrome: Secondary | ICD-10-CM | POA: Diagnosis not present

## 2016-01-04 DIAGNOSIS — F4323 Adjustment disorder with mixed anxiety and depressed mood: Secondary | ICD-10-CM | POA: Diagnosis not present

## 2016-01-12 DIAGNOSIS — R197 Diarrhea, unspecified: Secondary | ICD-10-CM | POA: Diagnosis not present

## 2016-01-18 DIAGNOSIS — F4323 Adjustment disorder with mixed anxiety and depressed mood: Secondary | ICD-10-CM | POA: Diagnosis not present

## 2016-01-25 DIAGNOSIS — Z8349 Family history of other endocrine, nutritional and metabolic diseases: Secondary | ICD-10-CM | POA: Diagnosis not present

## 2016-01-25 DIAGNOSIS — R61 Generalized hyperhidrosis: Secondary | ICD-10-CM | POA: Diagnosis not present

## 2016-01-25 DIAGNOSIS — E038 Other specified hypothyroidism: Secondary | ICD-10-CM | POA: Diagnosis not present

## 2016-01-26 DIAGNOSIS — G894 Chronic pain syndrome: Secondary | ICD-10-CM | POA: Diagnosis not present

## 2016-02-02 DIAGNOSIS — F4323 Adjustment disorder with mixed anxiety and depressed mood: Secondary | ICD-10-CM | POA: Diagnosis not present

## 2016-02-05 DIAGNOSIS — Z79899 Other long term (current) drug therapy: Secondary | ICD-10-CM | POA: Diagnosis not present

## 2016-02-15 DIAGNOSIS — F4323 Adjustment disorder with mixed anxiety and depressed mood: Secondary | ICD-10-CM | POA: Diagnosis not present

## 2016-02-23 DIAGNOSIS — M4716 Other spondylosis with myelopathy, lumbar region: Secondary | ICD-10-CM | POA: Diagnosis not present

## 2016-02-23 DIAGNOSIS — Z79899 Other long term (current) drug therapy: Secondary | ICD-10-CM | POA: Diagnosis not present

## 2016-02-23 DIAGNOSIS — M5417 Radiculopathy, lumbosacral region: Secondary | ICD-10-CM | POA: Diagnosis not present

## 2016-02-23 DIAGNOSIS — M419 Scoliosis, unspecified: Secondary | ICD-10-CM | POA: Diagnosis not present

## 2016-02-23 DIAGNOSIS — G894 Chronic pain syndrome: Secondary | ICD-10-CM | POA: Diagnosis not present

## 2016-02-29 DIAGNOSIS — F4323 Adjustment disorder with mixed anxiety and depressed mood: Secondary | ICD-10-CM | POA: Diagnosis not present

## 2016-03-10 DIAGNOSIS — E038 Other specified hypothyroidism: Secondary | ICD-10-CM | POA: Diagnosis not present

## 2016-03-14 DIAGNOSIS — F4323 Adjustment disorder with mixed anxiety and depressed mood: Secondary | ICD-10-CM | POA: Diagnosis not present

## 2016-03-22 DIAGNOSIS — M791 Myalgia: Secondary | ICD-10-CM | POA: Diagnosis not present

## 2016-03-22 DIAGNOSIS — G894 Chronic pain syndrome: Secondary | ICD-10-CM | POA: Diagnosis not present

## 2016-03-22 DIAGNOSIS — M4716 Other spondylosis with myelopathy, lumbar region: Secondary | ICD-10-CM | POA: Diagnosis not present

## 2016-03-28 DIAGNOSIS — F4323 Adjustment disorder with mixed anxiety and depressed mood: Secondary | ICD-10-CM | POA: Diagnosis not present

## 2016-05-09 ENCOUNTER — Ambulatory Visit: Payer: BLUE CROSS/BLUE SHIELD | Admitting: Neurology

## 2016-05-09 ENCOUNTER — Telehealth: Payer: Self-pay

## 2016-05-09 ENCOUNTER — Telehealth: Payer: Self-pay | Admitting: Neurology

## 2016-05-09 NOTE — Telephone Encounter (Signed)
This patient did not show for a revisit on today. 

## 2016-05-09 NOTE — Telephone Encounter (Signed)
Pt no-showed his revisit appt today.

## 2016-05-11 ENCOUNTER — Encounter: Payer: Self-pay | Admitting: Neurology

## 2016-06-01 ENCOUNTER — Encounter: Payer: Self-pay | Admitting: Family

## 2016-06-08 ENCOUNTER — Inpatient Hospital Stay (HOSPITAL_COMMUNITY): Admission: RE | Admit: 2016-06-08 | Payer: BLUE CROSS/BLUE SHIELD | Source: Ambulatory Visit

## 2016-06-08 ENCOUNTER — Ambulatory Visit: Payer: BLUE CROSS/BLUE SHIELD | Admitting: Family

## 2016-06-09 ENCOUNTER — Emergency Department (HOSPITAL_COMMUNITY)
Admission: EM | Admit: 2016-06-09 | Discharge: 2016-06-09 | Disposition: A | Discharge status: Home / Self Care | Payer: BLUE CROSS/BLUE SHIELD | Attending: Emergency Medicine | Admitting: Emergency Medicine

## 2016-06-09 ENCOUNTER — Emergency Department (HOSPITAL_COMMUNITY): Payer: Self-pay

## 2016-06-09 DIAGNOSIS — M5136 Other intervertebral disc degeneration, lumbar region: Secondary | ICD-10-CM | POA: Insufficient documentation

## 2016-06-09 DIAGNOSIS — Z79899 Other long term (current) drug therapy: Secondary | ICD-10-CM | POA: Insufficient documentation

## 2016-06-09 DIAGNOSIS — Z87891 Personal history of nicotine dependence: Secondary | ICD-10-CM | POA: Insufficient documentation

## 2016-06-09 DIAGNOSIS — Z7982 Long term (current) use of aspirin: Secondary | ICD-10-CM | POA: Insufficient documentation

## 2016-06-09 DIAGNOSIS — M5442 Lumbago with sciatica, left side: Secondary | ICD-10-CM | POA: Insufficient documentation

## 2016-06-09 DIAGNOSIS — M5441 Lumbago with sciatica, right side: Secondary | ICD-10-CM | POA: Insufficient documentation

## 2016-06-09 DIAGNOSIS — M47816 Spondylosis without myelopathy or radiculopathy, lumbar region: Secondary | ICD-10-CM | POA: Insufficient documentation

## 2016-06-09 DIAGNOSIS — G8929 Other chronic pain: Secondary | ICD-10-CM

## 2016-06-09 DIAGNOSIS — F909 Attention-deficit hyperactivity disorder, unspecified type: Secondary | ICD-10-CM | POA: Insufficient documentation

## 2016-06-09 LAB — I-STAT CG4 LACTIC ACID, ED: Lactic Acid, Venous: 1.56 mmol/L (ref 0.5–1.9)

## 2016-06-09 LAB — COMPREHENSIVE METABOLIC PANEL
ALBUMIN: 4.3 g/dL (ref 3.5–5.0)
ALK PHOS: 65 U/L (ref 38–126)
ALT: 21 U/L (ref 17–63)
AST: 30 U/L (ref 15–41)
Anion gap: 6 (ref 5–15)
BILIRUBIN TOTAL: 0.9 mg/dL (ref 0.3–1.2)
BUN: 27 mg/dL — AB (ref 6–20)
CALCIUM: 9.6 mg/dL (ref 8.9–10.3)
CO2: 25 mmol/L (ref 22–32)
Chloride: 110 mmol/L (ref 101–111)
Creatinine, Ser: 1.21 mg/dL (ref 0.61–1.24)
GFR calc Af Amer: >60 mL/min (ref >60)
GFR calc non Af Amer: >60 mL/min (ref >60)
GLUCOSE: 114 mg/dL — AB (ref 65–99)
Potassium: 4 mmol/L (ref 3.5–5.1)
SODIUM: 141 mmol/L (ref 135–145)
TOTAL PROTEIN: 7.7 g/dL (ref 6.5–8.1)

## 2016-06-09 LAB — URINALYSIS, ROUTINE W REFLEX MICROSCOPIC
Bilirubin Urine: NEGATIVE
Glucose, UA: NEGATIVE mg/dL
Hgb urine dipstick: NEGATIVE
KETONES UR: NEGATIVE mg/dL
Leukocytes, UA: NEGATIVE
NITRITE: NEGATIVE
PROTEIN: 30 mg/dL — AB
Specific Gravity, Urine: 1.019 (ref 1.005–1.030)
Squamous Epithelial / LPF: NONE SEEN
WBC UA: NONE SEEN WBC/hpf (ref 0–5)
pH: 8 (ref 5.0–8.0)

## 2016-06-09 LAB — CBC
HEMATOCRIT: 44.1 % (ref 39.0–52.0)
Hemoglobin: 16.1 g/dL (ref 13.0–17.0)
MCH: 32.5 pg (ref 26.0–34.0)
MCHC: 36.5 g/dL — AB (ref 30.0–36.0)
MCV: 88.9 fL (ref 78.0–100.0)
Platelets: 248 10*3/uL (ref 150–400)
RBC: 4.96 MIL/uL (ref 4.22–5.81)
RDW: 15.2 % (ref 11.5–15.5)
WBC: 9.9 10*3/uL (ref 4.0–10.5)

## 2016-06-09 LAB — DIFFERENTIAL
BASOS ABS: 0 10*3/uL (ref 0.0–0.1)
BASOS PCT: 0 %
Eosinophils Absolute: 0.2 10*3/uL (ref 0.0–0.7)
Eosinophils Relative: 2 %
Lymphocytes Relative: 17 %
Lymphs Abs: 1.7 10*3/uL (ref 0.7–4.0)
Monocytes Absolute: 0.7 10*3/uL (ref 0.1–1.0)
Monocytes Relative: 7 %
NEUTROS PCT: 74 %
Neutro Abs: 7.5 10*3/uL (ref 1.7–7.7)

## 2016-06-09 LAB — SEDIMENTATION RATE: SED RATE: 8 mm/h (ref 0–16)

## 2016-06-09 LAB — LIPASE, BLOOD: Lipase: 31 U/L (ref 11–51)

## 2016-06-09 MED ORDER — SODIUM CHLORIDE 0.9 % IV BOLUS (SEPSIS)
1000.0000 mL | Freq: Once | INTRAVENOUS | Status: AC
  Administered 2016-06-09: 1000 mL via INTRAVENOUS

## 2016-06-09 MED ORDER — ONDANSETRON 4 MG PO TBDP
4.0000 mg | ORAL_TABLET | Freq: Once | ORAL | Status: AC | PRN
  Administered 2016-06-09: 4 mg via ORAL
  Filled 2016-06-09: qty 1

## 2016-06-09 MED ORDER — LORAZEPAM 2 MG/ML IJ SOLN
1.0000 mg | Freq: Once | INTRAMUSCULAR | Status: AC
  Administered 2016-06-09: 1 mg via INTRAVENOUS
  Filled 2016-06-09: qty 1

## 2016-06-09 MED ORDER — KETOROLAC TROMETHAMINE 30 MG/ML IJ SOLN
30.0000 mg | Freq: Once | INTRAMUSCULAR | Status: AC
  Administered 2016-06-09: 30 mg via INTRAVENOUS
  Filled 2016-06-09: qty 1

## 2016-06-09 NOTE — ED Notes (Signed)
Attempt 2nd IV placement in hand unsuccessful

## 2016-06-09 NOTE — ED Notes (Signed)
Patient transported to MRI 

## 2016-06-09 NOTE — ED Provider Notes (Addendum)
[facility]-EMERGENCY DEPT Provider Note   CSN: [id number] Arrival date & time: 06/09/16  1605     History   Chief Complaint Chief Complaint  Patient presents with  . Emesis    HPI Charles Stevens is a 56 y.o. male.  Patient presents for evaluation of low back pain.  He reports it started several weeks ago and has caused vomiting which has been present for 2 weeks.  He also has loose stool.  He thinks that he has an infection in his back and wants to get a "biopsy" to diagnose it.  He is taking his usual pain medication which he takes chronically, without relief.  He has not seen his surgeon who treats his chronic pain, recently.  He denies cough shortness of breath fever focal weakness urinary or bowel incontinence.  There are no other known modifying factors.  HPI  Past Medical History:  Diagnosis Date  . Anemia   . Aneurysm (HCC)   . Attention deficit disorder   . Barrett's esophagus   . Cervical disc disorder with radiculopathy of cervical region 10/09/2014   Chronic right C7 radiculopathy  . Cervical spondylosis   . Chronic prostatitis    Possible chronic prostatitis  . Dysuria    Hx. of Dysuria  . Foot drop, left   . Memory loss   . Memory loss of unknown cause    dr [name] 2014 . NP Darrol Angel   . Rheumatoid arthritis(714.0)     Patient Active Problem List   Diagnosis Date Noted  . Cervical disc disorder with radiculopathy of cervical region 10/09/2014  . Hypersomnia, persistent 08/31/2012  . Memory loss of unknown cause   . Encounter for therapeutic drug monitoring 04/18/2012  . Memory loss 04/18/2012  . Aphasia 04/18/2012    Past Surgical History:  Procedure Laterality Date  . CERVICAL FUSION  2012, 2013 X2  and 2014  . NOSE SURGERY     with uvulectomy       Home Medications    Prior to Admission medications   Medication Sig Start Date End Date Taking? Authorizing Provider  aspirin 81 MG chewable tablet Chew 81 mg by mouth daily.   Yes Historical  Provider, MD  baclofen (LIORESAL) 10 MG tablet Take 10-20 mg by mouth 3 (three) times daily.    Yes Historical Provider, MD  buPROPion (WELLBUTRIN SR) 100 MG 12 hr tablet Take 150 mg by mouth daily with breakfast.   Yes Historical Provider, MD  celecoxib (CELEBREX) 200 MG capsule Take 200 mg by mouth daily.   Yes Historical Provider, MD  HUMIRA PEN 40 MG/0.8ML injection Inject 40 mg into the skin every 14 (fourteen) days.  07/23/12  Yes Historical Provider, MD  Levothyroxine Sodium 50 MCG CAPS Take 50 mcg by mouth daily before breakfast.    Yes Historical Provider, MD  lisinopril (PRINIVIL,ZESTRIL) 20 MG tablet Take 20 mg by mouth daily.   Yes Historical Provider, MD  mirtazapine (REMERON) 45 MG tablet Take 45 mg by mouth at bedtime.   Yes Historical Provider, MD  nicotine (NICODERM CQ - DOSED IN MG/24 HOURS) 14 mg/24hr patch Place 14 mg onto the skin daily.   Yes Historical Provider, MD  omeprazole (PRILOSEC OTC) 20 MG tablet Take 20 mg by mouth 2 (two) times daily.   Yes Historical Provider, MD  ondansetron (ZOFRAN) 4 MG tablet Take 4 mg by mouth 3 (three) times daily as needed for nausea or vomiting.    Yes Historical Provider, MD  oxyCODONE (ROXICODONE) 15 MG immediate release tablet Take 15 mg by mouth every 6 (six) hours as needed for pain.    Yes Historical Provider, MD  pramipexole (MIRAPEX) 0.25 MG tablet Take 0.75 mg by mouth at bedtime.   Yes Historical Provider, MD  pregabalin (LYRICA) 150 MG capsule Take 150 mg by mouth 3 (three) times daily.    Yes Historical Provider, MD  PRESCRIPTION MEDICATION Deep Lumbar Injections at Dr's office- steroid   Yes Historical Provider, MD  propranolol (INDERAL) 40 MG tablet Take 40 mg by mouth 2 (two) times daily.    Yes Historical Provider, MD  VOLTAREN 1 % GEL Apply 2 g topically 2 (two) times daily as needed (for pain).  06/12/12  Yes Historical Provider, MD    Family History Family History  Problem Relation Age of Onset  . Cancer Mother      Colon, Breast, Liver  . Varicose Veins Mother   . Hyperlipidemia Mother   . Hyperlipidemia Father   . Hypertension Father   . Heart attack Father   . Diabetes Brother   . Heart disease Brother   . Peripheral vascular disease Brother   . Deep vein thrombosis Brother   . Heart attack Brother     Social History Social History  Substance Use Topics  . Smoking status: Former Smoker    Types: Pipe  . Smokeless tobacco: Never Used     Comment: Quit in April  . Alcohol use No     Allergies   Patient has no known allergies.   Review of Systems Review of Systems  All other systems reviewed and are negative.    Physical Exam Updated Vital Signs BP 139/88 (BP Location: Right Arm)   Pulse 70   Temp 100.1 F (37.8 C) (Rectal)   Resp 16   Ht 6' (1.829 m)   Wt 190 lb (86.2 kg)   SpO2 100%   BMI 25.77 kg/m   Physical Exam  Constitutional: He is oriented to person, place, and time. He appears well-developed and well-nourished.  HENT:  Head: Normocephalic and atraumatic.  Right Ear: External ear normal.  Left Ear: External ear normal.  Eyes: Conjunctivae and EOM are normal. Pupils are equal, round, and reactive to light.  Neck: Normal range of motion and phonation normal. Neck supple.  Cardiovascular: Normal rate, regular rhythm and normal heart sounds.   Pulmonary/Chest: Effort normal and breath sounds normal. He exhibits no bony tenderness.  Abdominal: Soft. There is no tenderness.  Musculoskeletal: Normal range of motion.  Mild lumbar tenderness, bilaterally.  Negative straight leg raising bilaterally.  Neurological: He is alert and oriented to person, place, and time. No cranial nerve deficit or sensory deficit. He exhibits normal muscle tone. Coordination normal.  Skin: Skin is warm, dry and intact.  Psychiatric: He has a normal mood and affect. His behavior is normal. Judgment and thought content normal.  Nursing note and vitals reviewed.    ED Treatments /  Results  Labs (all labs ordered are listed, but only abnormal results are displayed) Labs Reviewed  COMPREHENSIVE METABOLIC PANEL - Abnormal; Notable for the following:       Result Value   Glucose, Bld 114 (*)    BUN 27 (*)    All other components within normal limits  CBC - Abnormal; Notable for the following:    MCHC 36.5 (*)    All other components within normal limits  URINALYSIS, ROUTINE W REFLEX MICROSCOPIC - Abnormal; Notable for the  following:    APPearance CLOUDY (*)    Protein, ur 30 (*)    Bacteria, UA RARE (*)    All other components within normal limits  CULTURE, BLOOD (ROUTINE X 2)  CULTURE, BLOOD (ROUTINE X 2)  URINE CULTURE  LIPASE, BLOOD  SEDIMENTATION RATE  DIFFERENTIAL  CBC WITH DIFFERENTIAL/PLATELET  I-STAT CG4 LACTIC ACID, ED    EKG  EKG Interpretation None       Radiology Mr Lumbar Spine Wo Contrast  Result Date: 06/09/2016 CLINICAL DATA:  56 y/o M; progressive back pain and history of multiple back surgeries. EXAM: MRI LUMBAR SPINE WITHOUT CONTRAST TECHNIQUE: Multiplanar, multisequence MR imaging of the lumbar spine was performed. No intravenous contrast was administered. COMPARISON:  09/15/2015 CT lumbar spine.  10/04/2014 lumbar MRI. FINDINGS: Segmentation:  Standard. Alignment: Mild lumbar levocurvature with apex at L3. Normal lumbar lordosis without listhesis. Vertebrae: No fracture, evidence of discitis, or bone lesion. Degenerative facet edema at the right L4-5 and left L4-5/L5-S1 levels. Conus medullaris: Extends to the T12-L1 level and appears normal. Paraspinal and other soft tissues: Negative. Disc levels: L1-2: Stable small disc bulge without significant foraminal narrowing or canal stenosis. L2-3: Small disc bulge with new right foraminal/subarticular disc protrusion and mild facet hypertrophy. Mild right foraminal and lateral recess narrowing with contact upon the descending right L3 nerve root. L3-4: Stable small disc bulge and right foraminal  posterior marginal osteophytes with mild facet hypertrophy. Mild right foraminal and lateral recess narrowing. No significant canal stenosis. L4-5: Stable moderate disc bulge with moderate facet and ligamentum flavum hypertrophy greater on the left. Moderate left and mild right foraminal narrowing. Severe canal stenosis. L5-S1: Stable disc bulge eccentric to the left with left-sided foraminal and extraforaminal marginal osteophytes. Severe left and moderate right facet hypertrophy and moderate bilateral ligamentum flavum hypertrophy. Stable severe left and mild right foraminal narrowing. Mild canal stenosis. Left-greater-than-right lateral recess effacement with contact upon the descending left S1 nerve root. IMPRESSION: 1. Lumbar spondylosis is largely stable with mild progression at the L2-3 level. 2. Degenerative facet edema at the right L4-5 and left L4-5/L5-S1 levels. 3. Stable multifactorial severe canal stenosis at L4-5. 4. Stable multilevel mild foraminal narrowing with moderate left foraminal narrowing at L4-5 and severe left foraminal narrowing at L5-S1. 5. New right L2-3 foraminal/subarticular disc protrusion with contact upon the descending right L3 nerve root in the lateral recess. 6. Stable disc and facet disease effacing the left lateral recess at L5-S1 with contact upon descending left S1 nerve root. Electronically Signed   By: Mitzi Hansen M.D.   On: 06/09/2016 20:28   Dg Chest Port 1 View  Result Date: 06/09/2016 CLINICAL DATA:  Nausea, epigastric pain and emesis times 15 days. EXAM: PORTABLE CHEST 1 VIEW COMPARISON:  CXR 12/16/2015 FINDINGS: Heart size is normal. The thoracic aorta is tortuous with atherosclerosis. No aneurysm. Mild interstitial prominence is noted bilaterally which may reflect a component mild interstitial edema or bronchitic change. Partially visualized lower cervical and upper thoracic fusion hardware is noted as before. IMPRESSION: Mild interstitial prominence  bilaterally which may reflect bronchitic change or potentially mild interstitial edema. No pneumonic consolidation, effusion or pneumothorax. Aortic atherosclerosis. Electronically Signed   By: Tollie Eth M.D.   On: 06/09/2016 18:26    Procedures Procedures (including critical care time)  Medications Ordered in ED Medications  ondansetron (ZOFRAN-ODT) disintegrating tablet 4 mg (4 mg Oral Given 06/09/16 1617)  sodium chloride 0.9 % bolus 1,000 mL (0 mLs Intravenous Stopped 06/09/16 2027)  And  sodium chloride 0.9 % bolus 1,000 mL (0 mLs Intravenous Stopped 06/09/16 2027)    And  sodium chloride 0.9 % bolus 1,000 mL (1,000 mLs Intravenous New Bag/Given 06/09/16 1915)  ketorolac (TORADOL) 30 MG/ML injection 30 mg (30 mg Intravenous Given 06/09/16 1844)  LORazepam (ATIVAN) injection 1 mg (1 mg Intravenous Given 06/09/16 1847)     Initial Impression / Assessment and Plan / ED Course  I have reviewed the triage vital signs and the nursing notes.  Pertinent labs & imaging results that were available during my care of the patient were reviewed by me and considered in my medical decision making (see chart for details).     Medications  ondansetron (ZOFRAN-ODT) disintegrating tablet 4 mg (4 mg Oral Given 06/09/16 1617)  sodium chloride 0.9 % bolus 1,000 mL (0 mLs Intravenous Stopped 06/09/16 2027)    And  sodium chloride 0.9 % bolus 1,000 mL (0 mLs Intravenous Stopped 06/09/16 2027)    And  sodium chloride 0.9 % bolus 1,000 mL (1,000 mLs Intravenous New Bag/Given 06/09/16 1915)  ketorolac (TORADOL) 30 MG/ML injection 30 mg (30 mg Intravenous Given 06/09/16 1844)  LORazepam (ATIVAN) injection 1 mg (1 mg Intravenous Given 06/09/16 1847)    Patient Vitals for the past 24 hrs:  BP Temp Temp src Pulse Resp SpO2 Height Weight  06/09/16 2137 - - - - - - 6' (1.829 m) 190 lb (86.2 kg)  06/09/16 2124 - 100.1 F (37.8 C) Rectal - - - - -  06/09/16 2015 139/88 99.3 F (37.4 C) Oral 70 16 100 % - -  06/09/16 1830 -  - - (!) 58 15 100 % - -  06/09/16 1816 159/98 - - (!) 54 17 100 % - -  06/09/16 1708 - 97.5 F (36.4 C) Oral (!) 56 (!) 30 100 % - -  06/09/16 1609 (!) 153/118 97.5 F (36.4 C) Oral 82 25 100 % - -    10:05 PM Reevaluation with update and discussion. After initial assessment and treatment, an updated evaluation reveals at this time he is much more comfortable.  He is laughing and joking with family members.  Findings discussed with patient and all questions answered. Nickia Boesen L    Final Clinical Impressions(s) / ED Diagnoses   Final diagnoses:  Chronic midline low back pain with bilateral sciatica  DDD (degenerative disc disease), lumbar  Facet arthritis of lumbar region [facility])   Neck pain nonspecific ongoing and chronic.  Some progression of pain recently with vomiting.  No evidence for lumbar myelopathy, discitis, or significantly worsening lumbar radiculopathy.  Patient improved after symptomatic treatment.  Nursing Notes Reviewed/ Care Coordinated Applicable Imaging Reviewed Interpretation of Laboratory Data incorporated into ED treatment  The patient appears reasonably screened and/or stabilized for discharge and I doubt any other medical condition or other [facility] requiring further screening, evaluation, or treatment in the ED at this time prior to discharge.  Plan: Home Medications- usual; Home Treatments- rest; return here if the recommended treatment, does not improve the symptoms; Recommended follow up- PCP and Neurosurgery prn   New Prescriptions New Prescriptions   No medications on file     Mancel Bale, MD 06/09/16 9628    Mancel Bale, MD 06/23/16 1118

## 2016-06-09 NOTE — Discharge Instructions (Signed)
Continue taking your regular medications.   He was seen in the sore area 3 or 4 times a day.  Follow up with your PCP, or back surgeon, for further care of your back problem.  Let them know that the MRI today shows progression of your chronic lower back disease.

## 2016-06-09 NOTE — ED Notes (Signed)
The 2 NS bolus that were started @ 1835 had to be discontinued shortly after pt went to MRI. MRI staff informed nurse that while pt was in the bathroom, he laid the bags of fluid on the bathroom floor and the bags backed up with blood. Restarting 2 1,000 mL bags at this time.

## 2016-06-09 NOTE — ED Notes (Signed)
Please note that there is no BP as pt refused to have BP taken as it was too painful

## 2016-06-09 NOTE — ED Notes (Signed)
Took Pt's temp orally, Pt is requesting a rectal temperature.

## 2016-06-09 NOTE — ED Triage Notes (Signed)
Pt c/o severe nausea, emesis, epigastric and sharp low bilateral abdominal pain, diarrhea x 15 days, back pain progressing over months. Hx of multiple back surgeries and injections, spine pain causes emesis sometimes. Had Zofran ODT about 1300. Takes omeprazole for Barett's, worried he may have C. Difficile again. No blood in emesis or stool.

## 2016-06-10 LAB — URINE CULTURE: Culture: <10000 — AB

## 2016-06-14 LAB — CULTURE, BLOOD (ROUTINE X 2)
Culture: NO GROWTH
Culture: NO GROWTH

## 2016-07-02 ENCOUNTER — Emergency Department (HOSPITAL_COMMUNITY): Payer: Self-pay

## 2016-07-02 ENCOUNTER — Inpatient Hospital Stay (HOSPITAL_COMMUNITY)
Admission: EM | Admit: 2016-07-02 | Discharge: 2016-07-04 | DRG: 175 | Disposition: A | Discharge status: Home / Self Care | Payer: BLUE CROSS/BLUE SHIELD | Attending: Internal Medicine | Admitting: Internal Medicine

## 2016-07-02 ENCOUNTER — Encounter (HOSPITAL_COMMUNITY): Payer: Self-pay

## 2016-07-02 DIAGNOSIS — M5010 Cervical disc disorder with radiculopathy, unspecified cervical region: Secondary | ICD-10-CM | POA: Diagnosis present

## 2016-07-02 DIAGNOSIS — J189 Pneumonia, unspecified organism: Secondary | ICD-10-CM | POA: Diagnosis present

## 2016-07-02 DIAGNOSIS — Z981 Arthrodesis status: Secondary | ICD-10-CM

## 2016-07-02 DIAGNOSIS — R071 Chest pain on breathing: Secondary | ICD-10-CM

## 2016-07-02 DIAGNOSIS — R0602 Shortness of breath: Secondary | ICD-10-CM

## 2016-07-02 DIAGNOSIS — K2270 Barrett's esophagus without dysplasia: Secondary | ICD-10-CM | POA: Diagnosis present

## 2016-07-02 DIAGNOSIS — I10 Essential (primary) hypertension: Secondary | ICD-10-CM | POA: Diagnosis present

## 2016-07-02 DIAGNOSIS — E039 Hypothyroidism, unspecified: Secondary | ICD-10-CM | POA: Diagnosis present

## 2016-07-02 DIAGNOSIS — Z87891 Personal history of nicotine dependence: Secondary | ICD-10-CM

## 2016-07-02 DIAGNOSIS — R413 Other amnesia: Secondary | ICD-10-CM | POA: Diagnosis not present

## 2016-07-02 DIAGNOSIS — Z7982 Long term (current) use of aspirin: Secondary | ICD-10-CM

## 2016-07-02 DIAGNOSIS — I2699 Other pulmonary embolism without acute cor pulmonale: Principal | ICD-10-CM | POA: Diagnosis present

## 2016-07-02 DIAGNOSIS — N179 Acute kidney failure, unspecified: Secondary | ICD-10-CM | POA: Diagnosis not present

## 2016-07-02 DIAGNOSIS — M069 Rheumatoid arthritis, unspecified: Secondary | ICD-10-CM | POA: Diagnosis present

## 2016-07-02 DIAGNOSIS — I2609 Other pulmonary embolism with acute cor pulmonale: Secondary | ICD-10-CM

## 2016-07-02 DIAGNOSIS — Z79899 Other long term (current) drug therapy: Secondary | ICD-10-CM

## 2016-07-02 DIAGNOSIS — D72829 Elevated white blood cell count, unspecified: Secondary | ICD-10-CM | POA: Diagnosis present

## 2016-07-02 DIAGNOSIS — Z791 Long term (current) use of non-steroidal anti-inflammatories (NSAID): Secondary | ICD-10-CM

## 2016-07-02 LAB — BASIC METABOLIC PANEL
ANION GAP: 14 (ref 5–15)
BUN: 19 mg/dL (ref 6–20)
CHLORIDE: 104 mmol/L (ref 101–111)
CO2: 19 mmol/L — ABNORMAL LOW (ref 22–32)
Calcium: 9.4 mg/dL (ref 8.9–10.3)
Creatinine, Ser: 1.5 mg/dL — ABNORMAL HIGH (ref 0.61–1.24)
GFR calc Af Amer: 60 mL/min — ABNORMAL LOW (ref >60)
GFR calc non Af Amer: 51 mL/min — ABNORMAL LOW (ref >60)
Glucose, Bld: 111 mg/dL — ABNORMAL HIGH (ref 65–99)
POTASSIUM: 4.1 mmol/L (ref 3.5–5.1)
SODIUM: 137 mmol/L (ref 135–145)

## 2016-07-02 LAB — MRSA PCR SCREENING: MRSA BY PCR: NEGATIVE

## 2016-07-02 LAB — CBC
HCT: 44.8 % (ref 39.0–52.0)
HEMOGLOBIN: 15.9 g/dL (ref 13.0–17.0)
MCH: 33.3 pg (ref 26.0–34.0)
MCHC: 35.5 g/dL (ref 30.0–36.0)
MCV: 93.7 fL (ref 78.0–100.0)
Platelets: 320 10*3/uL (ref 150–400)
RBC: 4.78 MIL/uL (ref 4.22–5.81)
RDW: 14 % (ref 11.5–15.5)
WBC: 13.7 10*3/uL — ABNORMAL HIGH (ref 4.0–10.5)

## 2016-07-02 LAB — I-STAT TROPONIN, ED: TROPONIN I, POC: 0 ng/mL (ref 0.00–0.08)

## 2016-07-02 LAB — HEPARIN LEVEL (UNFRACTIONATED): HEPARIN UNFRACTIONATED: 0.16 [IU]/mL — AB (ref 0.30–0.70)

## 2016-07-02 LAB — ANTITHROMBIN III: AntiThromb III Func: 94 % (ref 75–120)

## 2016-07-02 LAB — BRAIN NATRIURETIC PEPTIDE: B Natriuretic Peptide: 23.3 pg/mL (ref 0.0–100.0)

## 2016-07-02 MED ORDER — HYDROMORPHONE HCL 1 MG/ML IJ SOLN
1.0000 mg | Freq: Once | INTRAMUSCULAR | Status: AC
  Administered 2016-07-02: 1 mg via INTRAVENOUS
  Filled 2016-07-02: qty 1

## 2016-07-02 MED ORDER — HYDROMORPHONE HCL 1 MG/ML IJ SOLN
1.0000 mg | Freq: Once | INTRAMUSCULAR | Status: AC
Start: 2016-07-02 — End: 2016-07-02
  Administered 2016-07-02: 1 mg via INTRAVENOUS
  Filled 2016-07-02: qty 1

## 2016-07-02 MED ORDER — IOPAMIDOL (ISOVUE-370) INJECTION 76%
INTRAVENOUS | Status: AC
  Administered 2016-07-02: 100 mL
  Filled 2016-07-02: qty 100

## 2016-07-02 MED ORDER — PRAMIPEXOLE DIHYDROCHLORIDE 0.25 MG PO TABS
0.7500 mg | ORAL_TABLET | Freq: Every day | ORAL | Status: DC
  Administered 2016-07-02 – 2016-07-03 (×2): 0.75 mg via ORAL
  Filled 2016-07-02 (×3): qty 3

## 2016-07-02 MED ORDER — BUPROPION HCL ER (SR) 150 MG PO TB12
150.0000 mg | ORAL_TABLET | Freq: Every day | ORAL | Status: DC
  Administered 2016-07-03 – 2016-07-04 (×2): 150 mg via ORAL
  Filled 2016-07-02 (×2): qty 1

## 2016-07-02 MED ORDER — MORPHINE SULFATE (PF) 4 MG/ML IV SOLN
4.0000 mg | Freq: Once | INTRAVENOUS | Status: AC
  Administered 2016-07-02: 4 mg via INTRAVENOUS
  Filled 2016-07-02: qty 1

## 2016-07-02 MED ORDER — SODIUM CHLORIDE 0.9% FLUSH
3.0000 mL | Freq: Two times a day (BID) | INTRAVENOUS | Status: DC
  Administered 2016-07-02: 3 mL via INTRAVENOUS

## 2016-07-02 MED ORDER — HEPARIN BOLUS VIA INFUSION
4000.0000 [IU] | Freq: Once | INTRAVENOUS | Status: AC
  Administered 2016-07-02: 4000 [IU] via INTRAVENOUS
  Filled 2016-07-02: qty 4000

## 2016-07-02 MED ORDER — ACETAMINOPHEN 650 MG RE SUPP: 650.0000 mg | Freq: Four times a day (QID) | RECTAL | Status: DC | PRN

## 2016-07-02 MED ORDER — SODIUM CHLORIDE 0.9 % IV SOLN
INTRAVENOUS | Status: DC
  Administered 2016-07-02: 17:00:00 via INTRAVENOUS

## 2016-07-02 MED ORDER — BACLOFEN 10 MG PO TABS
10.0000 mg | ORAL_TABLET | Freq: Three times a day (TID) | ORAL | Status: DC
  Administered 2016-07-02 (×2): 10 mg via ORAL
  Administered 2016-07-03 – 2016-07-04 (×5): 20 mg via ORAL
  Filled 2016-07-02 (×4): qty 2
  Filled 2016-07-02: qty 1
  Filled 2016-07-02 (×2): qty 2

## 2016-07-02 MED ORDER — NICOTINE 14 MG/24HR TD PT24: 14.0000 mg | MEDICATED_PATCH | Freq: Every day | TRANSDERMAL | Status: DC

## 2016-07-02 MED ORDER — LEVOTHYROXINE SODIUM 50 MCG PO TABS
50.0000 ug | ORAL_TABLET | Freq: Every day | ORAL | Status: DC
  Administered 2016-07-03 – 2016-07-04 (×2): 50 ug via ORAL
  Filled 2016-07-02 (×2): qty 1

## 2016-07-02 MED ORDER — OXYCODONE HCL 5 MG PO TABS
15.0000 mg | ORAL_TABLET | Freq: Four times a day (QID) | ORAL | Status: DC | PRN
  Administered 2016-07-02 – 2016-07-03 (×4): 15 mg via ORAL
  Filled 2016-07-02 (×4): qty 3

## 2016-07-02 MED ORDER — ACETAMINOPHEN 325 MG PO TABS: 650.0000 mg | ORAL_TABLET | Freq: Four times a day (QID) | ORAL | Status: DC | PRN

## 2016-07-02 MED ORDER — LIP MEDEX EX OINT: TOPICAL_OINTMENT | CUTANEOUS | Status: DC | PRN

## 2016-07-02 MED ORDER — HEPARIN BOLUS VIA INFUSION
3000.0000 [IU] | Freq: Once | INTRAVENOUS | Status: AC
  Administered 2016-07-02: 3000 [IU] via INTRAVENOUS
  Filled 2016-07-02: qty 3000

## 2016-07-02 MED ORDER — HEPARIN (PORCINE) IN NACL 100-0.45 UNIT/ML-% IJ SOLN
1900.0000 [IU]/h | INTRAMUSCULAR | Status: DC
  Administered 2016-07-02: 1400 [IU]/h via INTRAVENOUS
  Filled 2016-07-02 (×2): qty 250

## 2016-07-02 MED ORDER — ATOMOXETINE HCL 60 MG PO CAPS
100.0000 mg | ORAL_CAPSULE | Freq: Every day | ORAL | Status: DC
  Administered 2016-07-02 – 2016-07-04 (×3): 100 mg via ORAL
  Filled 2016-07-02 (×3): qty 1

## 2016-07-02 MED ORDER — ONDANSETRON HCL 4 MG PO TABS: 4.0000 mg | ORAL_TABLET | Freq: Four times a day (QID) | ORAL | Status: DC | PRN

## 2016-07-02 MED ORDER — HEPARIN SODIUM (PORCINE) 5000 UNIT/ML IJ SOLN
60.0000 [IU]/kg | Freq: Once | INTRAMUSCULAR | Status: DC
Start: 2016-07-02 — End: 2016-07-02

## 2016-07-02 MED ORDER — DEXTROSE 5 % IV SOLN
1.0000 g | INTRAVENOUS | Status: DC
  Administered 2016-07-02 – 2016-07-04 (×3): 1 g via INTRAVENOUS
  Filled 2016-07-02 (×3): qty 10

## 2016-07-02 MED ORDER — PROPRANOLOL HCL 40 MG PO TABS
40.0000 mg | ORAL_TABLET | Freq: Every day | ORAL | Status: DC
  Administered 2016-07-02 – 2016-07-03 (×2): 40 mg via ORAL
  Filled 2016-07-02 (×3): qty 1

## 2016-07-02 MED ORDER — BLISTEX MEDICATED EX OINT
TOPICAL_OINTMENT | CUTANEOUS | Status: DC | PRN
  Filled 2016-07-02: qty 6.3

## 2016-07-02 MED ORDER — ASPIRIN 81 MG PO CHEW
81.0000 mg | CHEWABLE_TABLET | Freq: Every day | ORAL | Status: DC
  Administered 2016-07-03 – 2016-07-04 (×2): 81 mg via ORAL
  Filled 2016-07-02 (×2): qty 1

## 2016-07-02 MED ORDER — MIRTAZAPINE 15 MG PO TABS
14.0000 mg | ORAL_TABLET | Freq: Every day | ORAL | Status: DC
  Filled 2016-07-02: qty 3

## 2016-07-02 MED ORDER — DEXTROSE 5 % IV SOLN
500.0000 mg | INTRAVENOUS | Status: DC
  Administered 2016-07-02 – 2016-07-03 (×2): 500 mg via INTRAVENOUS
  Filled 2016-07-02 (×4): qty 500

## 2016-07-02 MED ORDER — LISINOPRIL 20 MG PO TABS
20.0000 mg | ORAL_TABLET | Freq: Every day | ORAL | Status: DC
  Administered 2016-07-04: 20 mg via ORAL
  Filled 2016-07-02 (×2): qty 1

## 2016-07-02 MED ORDER — WARFARIN - PHARMACIST DOSING INPATIENT: Freq: Every day | Status: DC

## 2016-07-02 MED ORDER — ONDANSETRON HCL 4 MG/2ML IJ SOLN: 4.0000 mg | Freq: Four times a day (QID) | INTRAMUSCULAR | Status: DC | PRN

## 2016-07-02 MED ORDER — SODIUM CHLORIDE 0.9 % IV BOLUS (SEPSIS)
1000.0000 mL | Freq: Once | INTRAVENOUS | Status: AC
  Administered 2016-07-02: 1000 mL via INTRAVENOUS

## 2016-07-02 MED ORDER — PREGABALIN 25 MG PO CAPS
150.0000 mg | ORAL_CAPSULE | Freq: Three times a day (TID) | ORAL | Status: DC
Start: 2016-07-02 — End: 2016-07-04
  Administered 2016-07-02 – 2016-07-04 (×7): 150 mg via ORAL
  Filled 2016-07-02 (×7): qty 6

## 2016-07-02 MED ORDER — POLYETHYLENE GLYCOL 3350 17 G PO PACK: 17.0000 g | PACK | Freq: Every day | ORAL | Status: DC | PRN

## 2016-07-02 MED ORDER — WARFARIN SODIUM 7.5 MG PO TABS
7.5000 mg | ORAL_TABLET | Freq: Every day | ORAL | Status: DC
  Administered 2016-07-02: 7.5 mg via ORAL
  Filled 2016-07-02 (×2): qty 1

## 2016-07-02 MED ORDER — MIRTAZAPINE 7.5 MG PO TABS
30.0000 mg | ORAL_TABLET | Freq: Every day | ORAL | Status: DC
  Administered 2016-07-02 – 2016-07-03 (×2): 30 mg via ORAL
  Filled 2016-07-02 (×2): qty 4

## 2016-07-02 NOTE — ED Provider Notes (Signed)
[facility]-EMERGENCY DEPT Provider Note   CSN: [id number] Arrival date & time: 07/02/16  1014     History   Chief Complaint Chief Complaint  Patient presents with  . Chest Pain    pt c/o chest pain that started about one week ago in his R flank and has since moved o his chest     HPI Charles Stevens is a 56 y.o. male with history of chronic neck and back pain, hypertension, hyperlipidemia who presents with a one-week history of right flank pain which became right-sided chest pain yesterday. Patient woke up with his chest pain severe. He describes it as somewhat between sharp and pressure. It is located in the right side of his sternum. Patient's symptoms are pleuritic. His pain is also worse with laying down. Patient has had associated shortness of breath. Patient has been having nausea and vomiting every 3-5 days for the past month. Patient notes that he's had of fever of 100.5 intermittently for one month. These symptoms are being worked up by his PCP. He has found that azithromycin has helped his symptoms so he continues to take this. Patient took baby aspirin and his regular medications prior to coming today. He reports his pain is improved after aspirin and oxycodone. Patient denies any abdominal pain, urinary symptoms. Patient notes he has been more immobile lately due to his back pain. He denies any recent long trips, surgeries, cancer, or new leg pain or swelling.  HPI  Past Medical History:  Diagnosis Date  . Anemia   . Aneurysm (HCC)   . Attention deficit disorder   . Barrett's esophagus   . Cervical disc disorder with radiculopathy of cervical region 10/09/2014   Chronic right C7 radiculopathy  . Cervical spondylosis   . Chronic prostatitis    Possible chronic prostatitis  . Dysuria    Hx. of Dysuria  . Foot drop, left   . Memory loss   . Memory loss of unknown cause    dr [name] 2014 . NP Darrol Angel   . Rheumatoid arthritis(714.0)     Patient Active Problem List   Diagnosis Date Noted  . Pulmonary embolism (HCC) 07/02/2016  . Cervical disc disorder with radiculopathy of cervical region 10/09/2014  . Hypersomnia, persistent 08/31/2012  . Memory loss of unknown cause   . Encounter for therapeutic drug monitoring 04/18/2012  . Memory loss 04/18/2012  . Aphasia 04/18/2012    Past Surgical History:  Procedure Laterality Date  . CERVICAL FUSION  2012, 2013 X2  and 2014  . NOSE SURGERY     with uvulectomy       Home Medications    Prior to Admission medications   Medication Sig Start Date End Date Taking? Authorizing Provider  aspirin 81 MG chewable tablet Chew 81 mg by mouth daily.   Yes Historical Provider, MD  atomoxetine (STRATTERA) 100 MG capsule Take 100 mg by mouth daily.   Yes Historical Provider, MD  baclofen (LIORESAL) 10 MG tablet Take 10-20 mg by mouth 3 (three) times daily.    Yes Historical Provider, MD  buPROPion (WELLBUTRIN SR) 100 MG 12 hr tablet Take 150 mg by mouth daily with breakfast.   Yes Historical Provider, MD  celecoxib (CELEBREX) 200 MG capsule Take 200 mg by mouth daily.   Yes Historical Provider, MD  Levothyroxine Sodium 50 MCG CAPS Take 50 mcg by mouth daily before breakfast.    Yes Historical Provider, MD  lisinopril (PRINIVIL,ZESTRIL) 20 MG tablet Take 20 mg by  mouth daily.   Yes Historical Provider, MD  mirtazapine (REMERON) 45 MG tablet Take 14-45 mg by mouth at bedtime.    Yes Historical Provider, MD  nicotine (NICODERM CQ - DOSED IN MG/24 HOURS) 14 mg/24hr patch Place 14 mg onto the skin daily.   Yes Historical Provider, MD  omeprazole (PRILOSEC OTC) 20 MG tablet Take 20 mg by mouth 2 (two) times daily.   Yes Historical Provider, MD  ondansetron (ZOFRAN) 4 MG tablet Take 4 mg by mouth 3 (three) times daily as needed for nausea or vomiting.    Yes Historical Provider, MD  oxyCODONE (ROXICODONE) 15 MG immediate release tablet Take 15 mg by mouth every 6 (six) hours as needed for pain.    Yes Historical Provider, MD   pramipexole (MIRAPEX) 0.25 MG tablet Take 0.75 mg by mouth at bedtime.   Yes Historical Provider, MD  pregabalin (LYRICA) 150 MG capsule Take 150 mg by mouth 3 (three) times daily.    Yes Historical Provider, MD  propranolol (INDERAL) 40 MG tablet Take 40 mg by mouth at bedtime.    Yes Historical Provider, MD  VOLTAREN 1 % GEL Apply 2 g topically 2 (two) times daily as needed (for pain).  06/12/12  Yes Historical Provider, MD  HUMIRA PEN 40 MG/0.8ML injection Inject 40 mg into the skin every 14 (fourteen) days.  07/23/12   Historical Provider, MD  PRESCRIPTION MEDICATION Deep Lumbar Injections at Dr's office- steroid    Historical Provider, MD    Family History Family History  Problem Relation Age of Onset  . Cancer Mother     Colon, Breast, Liver  . Varicose Veins Mother   . Hyperlipidemia Mother   . Hyperlipidemia Father   . Hypertension Father   . Heart attack Father   . Diabetes Brother   . Heart disease Brother   . Peripheral vascular disease Brother   . Deep vein thrombosis Brother   . Heart attack Brother     Social History Social History  Substance Use Topics  . Smoking status: Former Smoker    Types: Pipe  . Smokeless tobacco: Never Used     Comment: Quit in April  . Alcohol use No     Allergies   Patient has no known allergies.   Review of Systems Review of Systems  Constitutional: Negative for chills and fever.  HENT: Negative for facial swelling and sore throat.   Respiratory: Positive for shortness of breath.   Cardiovascular: Positive for chest pain. Negative for leg swelling.  Gastrointestinal: Negative for abdominal pain, nausea and vomiting.  Genitourinary: Negative for dysuria.  Musculoskeletal: Negative for back pain.  Skin: Negative for rash and wound.  Neurological: Negative for headaches.  Psychiatric/Behavioral: The patient is not nervous/anxious.      Physical Exam Updated Vital Signs BP 135/90   Pulse 96   Resp 19   Wt 86.2 kg    SpO2 95%   BMI 25.77 kg/m   Physical Exam  Constitutional: He appears well-developed and well-nourished. No distress.  HENT:  Head: Normocephalic and atraumatic.  Mouth/Throat: Oropharynx is clear and moist. No oropharyngeal exudate.  Eyes: Conjunctivae are normal. Pupils are equal, round, and reactive to light. Right eye exhibits no discharge. Left eye exhibits no discharge. No scleral icterus.  Neck: Normal range of motion. Neck supple. No thyromegaly present.  Cardiovascular: Normal rate, regular rhythm, normal heart sounds and intact distal pulses.  Exam reveals no gallop and no friction rub.   No  murmur heard. Pulmonary/Chest: Effort normal and breath sounds normal. No stridor. No respiratory distress. He has no wheezes. He has no rales. He exhibits tenderness.    Abdominal: Soft. Bowel sounds are normal. He exhibits no distension. There is no tenderness. There is no rebound and no guarding.  Musculoskeletal: He exhibits no edema.  Lymphadenopathy:    He has no cervical adenopathy.  Neurological: He is alert. Coordination normal.  Skin: Skin is warm and dry. No rash noted. He is not diaphoretic. No pallor.  Psychiatric: He has a normal mood and affect.  Nursing note and vitals reviewed.    ED Treatments / Results  Labs (all labs ordered are listed, but only abnormal results are displayed) Labs Reviewed  BASIC METABOLIC PANEL - Abnormal; Notable for the following:       Result Value   CO2 19 (*)    Glucose, Bld 111 (*)    Creatinine, Ser 1.50 (*)    GFR calc non Af Amer 51 (*)    GFR calc Af Amer 60 (*)    All other components within normal limits  CBC - Abnormal; Notable for the following:    WBC 13.7 (*)    All other components within normal limits  BRAIN NATRIURETIC PEPTIDE  HEPARIN LEVEL (UNFRACTIONATED)  I-STAT TROPOININ, ED    EKG  EKG Interpretation None       Radiology Dg Chest 2 View  Result Date: 07/02/2016 CLINICAL DATA:  Patient with  centralized chest pain. Shortness of breath. EXAM: CHEST  2 VIEW COMPARISON:  Chest radiograph 06/09/2016 FINDINGS: Cervical spinal hardware. Normal cardiac and mediastinal contours. Interval development of consolidation within the right lower lobe. Small right pleural effusion. Minimal left basilar atelectasis. Thoracic spine degenerative changes. IMPRESSION: New consolidation within the right lower lobe with small right pleural effusion, concerning for pneumonia in the appropriate clinical setting. Followup PA and lateral chest X-ray is recommended in 3-4 weeks following trial of antibiotic therapy to ensure resolution and exclude underlying malignancy. Electronically Signed   By: Annia Belt M.D.   On: 07/02/2016 11:06   Ct Angio Chest Pe W And/or Wo Contrast  Result Date: 07/02/2016 CLINICAL DATA:  Shortness of breath and pleuritic chest pain. EXAM: CT ANGIOGRAPHY CHEST WITH CONTRAST TECHNIQUE: Multidetector CT imaging of the chest was performed using the standard protocol during bolus administration of intravenous contrast. Multiplanar CT image reconstructions and MIPs were obtained to evaluate the vascular anatomy. CONTRAST:  80 mL Isovue 370 COMPARISON:  Chest radiograph 07/02/2016.  Chest CT 04/07/2015 FINDINGS: Cardiovascular: Positive for bilateral pulmonary emboli. Small amount of clot extending into left upper lobe pulmonary arteries. Moderate amount of clot in the right lung involving the right upper, right middle and right lower lobe. The largest clot burden is in the right lower lobe segmental branches. RV: LV ratio is approximately 1. Ascending thoracic aorta is mildly aneurysmal measuring up to 4.1 cm. Few atherosclerotic calcifications at the aortic arch. Mediastinum/Nodes: Esophagus is unremarkable. There may be a small amount of residual thymic tissue which is similar to the previous examination. No significant mediastinal or hilar lymphadenopathy. No axillary lymphadenopathy. No significant  pericardial fluid. Lungs/Pleura: Small right pleural effusion. Atelectasis and consolidation in the right lower lobe. Peripheral opacities in the lateral right upper lobe on sequence 8, image 60 may be associated with pulmonary infarct. Consolidation and peripheral opacities in the right middle lobe which are also concerning for areas of infarct. Patchy parenchymal densities in left lower lobe most likely  associated with atelectasis. Presumed atelectasis in the lingula. Upper Abdomen: Images of the upper abdomen are unremarkable. Musculoskeletal: Query old fracture involving the right lateral eighth rib. Surgical hardware in the lower cervical spine and upper thoracic spine. Review of the MIP images confirms the above findings. IMPRESSION: Positive for bilateral pulmonary emboli. Moderate clot burden in the right lung, particularly in the right lower lobe. Volume loss and consolidation in the right lower lobe with areas of infarct in the right lung. Small clot burden on the left side of the chest. Difficult to assess for right heart strain due to the orientation of the heart. The RV:LV ratio is approximately 1 and findings are equivocal for heart strain. Recommend correlation with echocardiogram. Small right pleural effusion. Aneurysmal dilatation of the ascending thoracic aorta measuring up to 4.1 cm. Recommend annual imaging followup by CTA or MRA. This recommendation follows 2010 ACCF/AHA/AATS/ACR/ASA/SCA/SCAI/SIR/STS/SVM Guidelines for the Diagnosis and Management of Patients with Thoracic Aortic Disease. Circulation. 2010; 121: P619-J093 . Critical Value/emergent results were called by telephone at the time of interpretation on 07/02/2016 at 12:43 pm to Dr. Marily Memos , who verbally acknowledged these results. Electronically Signed   By: Richarda Overlie M.D.   On: 07/02/2016 12:50    Procedures Procedures (including critical care time)  CRITICAL CARE Performed by: Emi Holes   Total critical care  time: 30 minutes  Critical care time was exclusive of separately billable procedures and treating other patients.  Critical care was necessary to treat or prevent imminent or life-threatening deterioration.  Critical care was time spent personally by me on the following activities: development of treatment plan with patient and/or surrogate as well as nursing, discussions with consultants, evaluation of patient's response to treatment, examination of patient, obtaining history from patient or surrogate, ordering and performing treatments and interventions, ordering and review of laboratory studies, ordering and review of radiographic studies, pulse oximetry and re-evaluation of patient's condition.   Medications Ordered in ED Medications  heparin bolus via infusion 4,000 Units (not administered)  heparin ADULT infusion 100 units/mL (25000 units/244mL sodium chloride 0.45%) (not administered)  morphine 4 MG/ML injection 4 mg (not administered)  sodium chloride 0.9 % bolus 1,000 mL (1,000 mLs Intravenous New Bag/Given 07/02/16 1222)  iopamidol (ISOVUE-370) 76 % injection (100 mLs  Contrast Given 07/02/16 1205)     Initial Impression / Assessment and Plan / ED Course  I have reviewed the triage vital signs and the nursing notes.  Pertinent labs & imaging results that were available during my care of the patient were reviewed by me and considered in my medical decision making (see chart for details).     CBC shows WBC 13.7. BMP shows creatinine 1.50. Troponin negative. BNP 23.3. CT Angio chest shows: [Positive for bilateral pulmonary emboli. Moderate clot burden in the right lung, particularly in the right lower lobe. Volume loss and consolidation in the right lower lobe with areas of infarct in the right lung. Small clot burden on the left side of the chest. Difficult to assess for right heart strain due to the orientation of the heart. The RV:LV ratio is approximately 1 and findings are  equivocal for heart strain. Recommend correlation with echocardiogram. Small right pleural effusion. Aneurysmal dilatation of the ascending thoracic aorta measuring up to 4.1 cm. Recommend annual imaging followup by CTA or MRA.] Heparin initiated in the ED. Patient already taking azithromycin. Hospitalist will manage plus or minus antibiotics. I consulted hospitalist and spoke with Raymon Mutton, PA-C who  will admit the patient under Dr. Julien Nordmann. Patient understands and agrees with plan. Patient also evaluated by Dr. Clayborne Dana who guided the patient's management and agrees with plan. Patient vitals stable through ED course.  Final Clinical Impressions(s) / ED Diagnoses   Final diagnoses:  Other acute pulmonary embolism without acute cor pulmonale (HCC)  Chest pain on breathing  Shortness of breath    New Prescriptions New Prescriptions   No medications on file         Emi Holes, Cordelia Poche 07/02/16 1541    Marily Memos, MD 07/02/16 [phone]

## 2016-07-02 NOTE — Progress Notes (Signed)
ANTICOAGULATION CONSULT NOTE Pharmacy Consult for Heparin / Warfarin Indication: pulmonary embolus  No Known Allergies  Patient Measurements: Height: 5' 11.5" (181.6 cm) Weight: 191 lb 11.2 oz (87 kg) IBW/kg (Calculated) : 76.45 Heparin Dosing Weight: 86.2 kg  Vital Signs: Temp: 99.3 F (37.4 C) (03/24 1946) Temp Source: Oral (03/24 1609) BP: 118/72 (03/24 1946) Pulse Rate: 113 (03/24 1946)  Labs:  Recent Labs  07/02/16 1045 07/02/16 2015  HGB 15.9  --   HCT 44.8  --   PLT 320  --   HEPARINUNFRC  --  0.16*  CREATININE 1.50*  --     Estimated Creatinine Clearance: 61.6 mL/min (A) (by C-G formula based on SCr of 1.5 mg/dL (H)).     Assessment: 56 year old male admitted with bilateral pulmonary emboli to begin heparin and warfarin Initial heparin level = 0.16  Goal of Therapy:  Heparin level 0.3-0.7 units/ml Monitor platelets by anticoagulation protocol: Yes   Plan:  Heparin 3000 units iv bolus x 1 Heparin drip at 1700 units / hr 6 hr heparin level  Thank you Okey Regal, PharmD [phone]   07/02/2016,8:59 PM

## 2016-07-02 NOTE — ED Notes (Signed)
Hospitalist at bedside 

## 2016-07-02 NOTE — Progress Notes (Addendum)
ANTICOAGULATION CONSULT NOTE - Initial Consult  Pharmacy Consult for Heparin / Warfarin Indication: pulmonary embolus  No Known Allergies  Patient Measurements: Weight: 190 lb 0.6 oz (86.2 kg) Heparin Dosing Weight: 86.2 kg  Vital Signs: BP: 135/90 (03/24 1248) Pulse Rate: 96 (03/24 1249)  Labs:  Recent Labs  07/02/16 1045  HGB 15.9  HCT 44.8  PLT 320  CREATININE 1.50*    Estimated Creatinine Clearance: 62.5 mL/min (A) (by C-G formula based on SCr of 1.5 mg/dL (H)).   Medical History: Past Medical History:  Diagnosis Date  . Anemia   . Aneurysm (HCC)   . Attention deficit disorder   . Barrett's esophagus   . Cervical disc disorder with radiculopathy of cervical region 10/09/2014   Chronic right C7 radiculopathy  . Cervical spondylosis   . Chronic prostatitis    Possible chronic prostatitis  . Dysuria    Hx. of Dysuria  . Foot drop, left   . Memory loss   . Memory loss of unknown cause    dr [name] 2014 . NP Darrol Angel   . Rheumatoid arthritis(714.0)     Assessment: 56 year old male admitted with bilateral pulmonary emboli to begin heparin and warfarin  Goal of Therapy:  Heparin level 0.3-0.7 units/ml Monitor platelets by anticoagulation protocol: Yes   Plan:  Heparin 4000 units iv bolus x 1 Heparin drip at 1400 units / hr Warfarin 7.5 mg po daily at 1800 pm Heparin level 6 hours after heparin begins Daily heparin level, CBC, INR  Thank you Okey Regal, PharmD [phone]   07/02/2016,1:01 PM

## 2016-07-02 NOTE — ED Provider Notes (Signed)
Medical screening examination/treatment/procedure(s) were conducted as a shared visit with non-physician practitioner(s) and myself.  I personally evaluated the patient during the encounter.  56 year old male here with a month's worth of on and off short of breath and chest pain and initially got better with azithromycin over last weekend right flank pain that subsequently radiated to his left chest acuity here for evaluation. Slightly tachycardic on exam and a little bit tachypneic but otherwise is unremarkable. No chest tenderness. CT showed evidence of moderate clot burden in the right pulmonary vasculature and minimal emboli in the left. Some evidence of infarction in likely hemorrhage as well. Patient will be started on heparin and admitted the hospital to get echo and DVT studies done to evaluate for severity of disease.  CRITICAL CARE Performed by: Marily Memos Total critical care time: 35 minutes Critical care time was exclusive of separately billable procedures and treating other patients. Critical care was necessary to treat or prevent imminent or life-threatening deterioration. Critical care was time spent personally by me on the following activities: development of treatment plan with patient and/or surrogate as well as nursing, discussions with consultants, evaluation of patient's response to treatment, examination of patient, obtaining history from patient or surrogate, ordering and performing treatments and interventions, ordering and review of laboratory studies, ordering and review of radiographic studies, pulse oximetry and re-evaluation of patient's condition.     EKG Interpretation  Date/Time:  Saturday July 02 2016 10:30:33 EDT Ventricular Rate:  99 PR Interval:    QRS Duration: 95 QT Interval:  342 QTC Calculation: 439 R Axis:   -107 Text Interpretation:  Sinus rhythm Probable left atrial enlargement Inferior infarct, old S1Q3T3 not new compared to 2017 Confirmed by  [facility] MD, Arul Farabee [phone]) on 07/02/2016 3:39:44 PM         Marily Memos, MD 07/02/16 1540

## 2016-07-02 NOTE — ED Notes (Addendum)
Patient transported to CT 

## 2016-07-02 NOTE — H&P (Signed)
History and Physical    Charles Stevens WUG:[id number] DOB: 10/11/1960 DOA: 07/02/2016  PCP: Frederich Chick, MD Patient coming from: Home  Chief Complaint: chest pain  HPI: Charles Stevens is a 56 y.o. male with medical history significant for anemia, Barrett's esophagus, ADHD, rheumatoid arthritis, memory deficit, cervical disc disorder with chronic right C7 radiculopathy who presented to the emergency room with complaints of chest pain Patient reported that lately he has been on [employer] due to chronic pain and recently was seen by his PCP with complaints of chest pain associated with shortness of breath. He remembers the history of pneumonia that he had last year and insisted that primary physician will start him on antibiotic to prevent development of pneumonia. She was placed on Zithromax, but his symptoms still persisted day he woke up with severe chest pain describing. Like sharp and dull pressure at the same time. Located mainly on the right side of the chest near the sternal border. He also complained of shortness of breath. Of note, during the last month patient has been running fevers around 100.23F, and had intermittent nausea and vomiting; his PCP is aware and the symptoms are being worked up  ED Course: On presentation to the ED and patient was tachycardic with heart rate of 104, With respirations of 24, blood pressure was stable and oxygen saturation was 95% on room air Blood work showed a mild leukocytosis of 13,700, elevated creatinine at 1.50 with previously known normal creatinine 1.21 on 06/09/2016 Troponin was normal 0.00 Chest x-ray revealed new consolidation within the right lower lobe with small right pleural effusion concerning for pneumonia Chest CT was positive for bilateral pulmonary emboli, moderate clot burden in the right lung particularly on the right lower lobe  with volume loss and consolidation suspicious for infarct of the right lung . Small clot burden in the left side  of the chest. Right heart strain was not assessed due to the heart orientation and echocardiogram was recommended  Review of Systems: As per HPI otherwise 10 point review of systems negative.   Ambulatory Status: Independent  Past Medical History:  Diagnosis Date  . Anemia   . Aneurysm (HCC)   . Attention deficit disorder   . Barrett's esophagus   . Cervical disc disorder with radiculopathy of cervical region 10/09/2014   Chronic right C7 radiculopathy  . Cervical spondylosis   . Chronic prostatitis    Possible chronic prostatitis  . Dysuria    Hx. of Dysuria  . Foot drop, left   . Memory loss   . Memory loss of unknown cause    dr [name] 2014 . NP Darrol Angel   . Rheumatoid arthritis(714.0)     Past Surgical History:  Procedure Laterality Date  . CERVICAL FUSION  2012, 2013 X2  and 2014  . NOSE SURGERY     with uvulectomy    Social History   Social History  . Marital status: Legally Separated    Spouse name: N/A  . Number of children: 0  . Years of education: assoc   Occupational History  . [employer]   Social History Main Topics  . Smoking status: Former Smoker    Types: Pipe  . Smokeless tobacco: Never Used     Comment: Quit in April  . Alcohol use No  . Drug use: Yes    Types: Marijuana  . Sexual activity: Not on file   Other Topics Concern  . Not on file  Social History Narrative   Lives at home alone   Patient drinks 4 cups of caffeine daily.   Patient is right handed.     No Known Allergies  Family History  Problem Relation Age of Onset  . Cancer Mother     Colon, Breast, Liver  . Varicose Veins Mother   . Hyperlipidemia Mother   . Hyperlipidemia Father   . Hypertension Father   . Heart attack Father   . Diabetes Brother   . Heart disease Brother   . Peripheral vascular disease Brother   . Deep vein thrombosis Brother   . Heart attack Brother     Prior to Admission medications   Medication Sig Start Date  End Date Taking? Authorizing Provider  aspirin 81 MG chewable tablet Chew 81 mg by mouth daily.   Yes Historical Provider, MD  atomoxetine (STRATTERA) 100 MG capsule Take 100 mg by mouth daily.   Yes Historical Provider, MD  baclofen (LIORESAL) 10 MG tablet Take 10-20 mg by mouth 3 (three) times daily.    Yes Historical Provider, MD  buPROPion (WELLBUTRIN SR) 100 MG 12 hr tablet Take 150 mg by mouth daily with breakfast.   Yes Historical Provider, MD  celecoxib (CELEBREX) 200 MG capsule Take 200 mg by mouth daily.   Yes Historical Provider, MD  Levothyroxine Sodium 50 MCG CAPS Take 50 mcg by mouth daily before breakfast.    Yes Historical Provider, MD  lisinopril (PRINIVIL,ZESTRIL) 20 MG tablet Take 20 mg by mouth daily.   Yes Historical Provider, MD  mirtazapine (REMERON) 45 MG tablet Take 14-45 mg by mouth at bedtime.    Yes Historical Provider, MD  nicotine (NICODERM CQ - DOSED IN MG/24 HOURS) 14 mg/24hr patch Place 14 mg onto the skin daily.   Yes Historical Provider, MD  omeprazole (PRILOSEC OTC) 20 MG tablet Take 20 mg by mouth 2 (two) times daily.   Yes Historical Provider, MD  ondansetron (ZOFRAN) 4 MG tablet Take 4 mg by mouth 3 (three) times daily as needed for nausea or vomiting.    Yes Historical Provider, MD  oxyCODONE (ROXICODONE) 15 MG immediate release tablet Take 15 mg by mouth every 6 (six) hours as needed for pain.    Yes Historical Provider, MD  pramipexole (MIRAPEX) 0.25 MG tablet Take 0.75 mg by mouth at bedtime.   Yes Historical Provider, MD  pregabalin (LYRICA) 150 MG capsule Take 150 mg by mouth 3 (three) times daily.    Yes Historical Provider, MD  propranolol (INDERAL) 40 MG tablet Take 40 mg by mouth at bedtime.    Yes Historical Provider, MD  VOLTAREN 1 % GEL Apply 2 g topically 2 (two) times daily as needed (for pain).  06/12/12  Yes Historical Provider, MD  HUMIRA PEN 40 MG/0.8ML injection Inject 40 mg into the skin every 14 (fourteen) days.  07/23/12   Historical  Provider, MD  PRESCRIPTION MEDICATION Deep Lumbar Injections at Dr's office- steroid    Historical Provider, MD    Physical Exam: Vitals:   07/02/16 1300 07/02/16 1315 07/02/16 1400 07/02/16 1445  BP: (!) 128/110 119/70 (!) 128/93 (!) 124/91  Pulse: 93 94 97 98  Resp: (!) 26 20 (!) 21 15  SpO2: 99% 95% 96% 94%  Weight: 86.2 kg (190 lb 0.6 oz)        General: Appears calm and comfortable Eyes: PERRLA, EOMI, normal lids, iris ENT:  grossly normal hearing, lips & tongue, mucous membranes moist and intact Neck: no  lymphoadenopathy, masses or thyromegaly Cardiovascular: RRR, no m/r/g. No JVD, carotid bruits. No LE edema.  Respiratory: bilateral no wheezes, rales, rhonchi or cracles. Normal respiratory effort. No accessory muscle use observed Abdomen: soft, non-tender, non-distended, no organomegaly or masses appreciated. BS present in all quadrants Skin: no rash, ulcers or induration seen on limited exam Musculoskeletal: grossly normal tone BUE/BLE, good ROM, no bony abnormality or joint deformities observed Psychiatric: grossly normal mood and affect, speech fluent and appropriate, alert and oriented x3 Neurologic: CN II-XII grossly intact, moves all extremities in coordinated fashion, sensation intact  Labs on Admission: I have personally reviewed following labs and imaging studies  CBC, BMP  GFR: Estimated Creatinine Clearance: 62.5 mL/min (A) (by C-G formula based on SCr of 1.5 mg/dL (H)).   Creatinine Clearance: Estimated Creatinine Clearance: 62.5 mL/min (A) (by C-G formula based on SCr of 1.5 mg/dL (H)).   Radiological Exams on Admission: Dg Chest 2 View  Result Date: 07/02/2016 CLINICAL DATA:  Patient with centralized chest pain. Shortness of breath. EXAM: CHEST  2 VIEW COMPARISON:  Chest radiograph 06/09/2016 FINDINGS: Cervical spinal hardware. Normal cardiac and mediastinal contours. Interval development of consolidation within the right lower lobe. Small right pleural  effusion. Minimal left basilar atelectasis. Thoracic spine degenerative changes. IMPRESSION: New consolidation within the right lower lobe with small right pleural effusion, concerning for pneumonia in the appropriate clinical setting. Followup PA and lateral chest X-ray is recommended in 3-4 weeks following trial of antibiotic therapy to ensure resolution and exclude underlying malignancy. Electronically Signed   By: Annia Belt M.D.   On: 07/02/2016 11:06   Ct Angio Chest Pe W And/or Wo Contrast  Result Date: 07/02/2016 CLINICAL DATA:  Shortness of breath and pleuritic chest pain. EXAM: CT ANGIOGRAPHY CHEST WITH CONTRAST TECHNIQUE: Multidetector CT imaging of the chest was performed using the standard protocol during bolus administration of intravenous contrast. Multiplanar CT image reconstructions and MIPs were obtained to evaluate the vascular anatomy. CONTRAST:  80 mL Isovue 370 COMPARISON:  Chest radiograph 07/02/2016.  Chest CT 04/07/2015 FINDINGS: Cardiovascular: Positive for bilateral pulmonary emboli. Small amount of clot extending into left upper lobe pulmonary arteries. Moderate amount of clot in the right lung involving the right upper, right middle and right lower lobe. The largest clot burden is in the right lower lobe segmental branches. RV: LV ratio is approximately 1. Ascending thoracic aorta is mildly aneurysmal measuring up to 4.1 cm. Few atherosclerotic calcifications at the aortic arch. Mediastinum/Nodes: Esophagus is unremarkable. There may be a small amount of residual thymic tissue which is similar to the previous examination. No significant mediastinal or hilar lymphadenopathy. No axillary lymphadenopathy. No significant pericardial fluid. Lungs/Pleura: Small right pleural effusion. Atelectasis and consolidation in the right lower lobe. Peripheral opacities in the lateral right upper lobe on sequence 8, image 60 may be associated with pulmonary infarct. Consolidation and peripheral  opacities in the right middle lobe which are also concerning for areas of infarct. Patchy parenchymal densities in left lower lobe most likely associated with atelectasis. Presumed atelectasis in the lingula. Upper Abdomen: Images of the upper abdomen are unremarkable. Musculoskeletal: Query old fracture involving the right lateral eighth rib. Surgical hardware in the lower cervical spine and upper thoracic spine. Review of the MIP images confirms the above findings. IMPRESSION: Positive for bilateral pulmonary emboli. Moderate clot burden in the right lung, particularly in the right lower lobe. Volume loss and consolidation in the right lower lobe with areas of infarct in the right lung.  Small clot burden on the left side of the chest. Difficult to assess for right heart strain due to the orientation of the heart. The RV:LV ratio is approximately 1 and findings are equivocal for heart strain. Recommend correlation with echocardiogram. Small right pleural effusion. Aneurysmal dilatation of the ascending thoracic aorta measuring up to 4.1 cm. Recommend annual imaging followup by CTA or MRA. This recommendation follows 2010 ACCF/AHA/AATS/ACR/ASA/SCA/SCAI/SIR/STS/SVM Guidelines for the Diagnosis and Management of Patients with Thoracic Aortic Disease. Circulation. 2010; 121: R443-X540 . Critical Value/emergent results were called by telephone at the time of interpretation on 07/02/2016 at 12:43 pm to Dr. Marily Memos , who verbally acknowledged these results. Electronically Signed   By: Richarda Overlie M.D.   On: 07/02/2016 12:50    EKG: Independently reviewed - sinus rhythm, S1,Q3 T3 pattern  Assessment/Plan Principal Problem:   Pulmonary embolism (HCC) Active Problems:   Memory loss of unknown cause   Cervical disc disorder with radiculopathy of cervical region   Barrett's esophagus   Hypothyroidism   AKI (acute kidney injury) (HCC)    Acute pulmonary embolism with suspected RLLL pulmonary infarct Patient  reported his brother has a history of recurrent pulmonary embolism and he has been sedentary and mostly bedridden during the last month or so secondary to severe back pain Continue heparin and bridge with Coumadin, she had acute kidney injury right now and would refrain from starting Lovenox Case manager consult to determine the patient will be a candidate for assistance with one of the NOAC's  Check hypercoagulability panel, 2 D Echo to assess RV strain Continue supplemental O2 and Morphine IV for pain and SOB  Possible right lung CAP Initiate antibiotic treatment IV, nebs, expectorant as needed, supplemental O2  Barrett's Esophagus Continue PPI  Hypothyroidism - most recent TSH Continue Synthroid and if needed, adjust the dose  AKI Hold Celebrex, recheck renal function in am    DVT prophylaxis: Heparin-Coumadin bridge Code Status: Full Family Communication: None Disposition Plan: Stepdown Consults called: None Admission status: The patient    Vinetta Bergamo Pager: [phone] [facility]  If 7PM-7AM, please contact night-coverage [email or web address] Password [email or web address]  07/02/2016, 3:02 PM

## 2016-07-02 NOTE — ED Triage Notes (Signed)
Pt c/o pain that originally started in his R flank area and has since moved to his Central chest pt is on antibiotics for nausea

## 2016-07-02 NOTE — ED Notes (Signed)
Pt returned to room and placed back on monitor.  

## 2016-07-03 ENCOUNTER — Encounter (HOSPITAL_COMMUNITY): Payer: BLUE CROSS/BLUE SHIELD

## 2016-07-03 DIAGNOSIS — M5010 Cervical disc disorder with radiculopathy, unspecified cervical region: Secondary | ICD-10-CM | POA: Diagnosis not present

## 2016-07-03 DIAGNOSIS — K2270 Barrett's esophagus without dysplasia: Secondary | ICD-10-CM | POA: Diagnosis not present

## 2016-07-03 LAB — HIV ANTIBODY (ROUTINE TESTING W REFLEX): HIV SCREEN 4TH GENERATION: NONREACTIVE

## 2016-07-03 LAB — BASIC METABOLIC PANEL
Anion gap: 11 (ref 5–15)
BUN: 16 mg/dL (ref 6–20)
CO2: 21 mmol/L — ABNORMAL LOW (ref 22–32)
CREATININE: 1.27 mg/dL — AB (ref 0.61–1.24)
Calcium: 8.7 mg/dL — ABNORMAL LOW (ref 8.9–10.3)
Chloride: 100 mmol/L — ABNORMAL LOW (ref 101–111)
GFR calc Af Amer: >60 mL/min (ref >60)
Glucose, Bld: 117 mg/dL — ABNORMAL HIGH (ref 65–99)
POTASSIUM: 4.1 mmol/L (ref 3.5–5.1)
SODIUM: 132 mmol/L — AB (ref 135–145)

## 2016-07-03 LAB — PROTIME-INR
INR: 1.21
PROTHROMBIN TIME: 15.3 s — AB (ref 11.4–15.2)

## 2016-07-03 LAB — CBC
HEMATOCRIT: 38.7 % — AB (ref 39.0–52.0)
HEMOGLOBIN: 13.3 g/dL (ref 13.0–17.0)
MCH: 32.5 pg (ref 26.0–34.0)
MCHC: 34.4 g/dL (ref 30.0–36.0)
MCV: 94.6 fL (ref 78.0–100.0)
Platelets: 270 10*3/uL (ref 150–400)
RBC: 4.09 MIL/uL — ABNORMAL LOW (ref 4.22–5.81)
RDW: 14 % (ref 11.5–15.5)
WBC: 14.3 10*3/uL — ABNORMAL HIGH (ref 4.0–10.5)

## 2016-07-03 LAB — HEPARIN LEVEL (UNFRACTIONATED)
HEPARIN UNFRACTIONATED: 0.33 [IU]/mL (ref 0.30–0.70)
Heparin Unfractionated: 0.39 IU/mL (ref 0.30–0.70)

## 2016-07-03 MED ORDER — APIXABAN 5 MG PO TABS: 5.0000 mg | ORAL_TABLET | Freq: Two times a day (BID) | ORAL | Status: DC

## 2016-07-03 MED ORDER — SODIUM CHLORIDE 0.9 % IV SOLN: INTRAVENOUS | Status: AC

## 2016-07-03 MED ORDER — APIXABAN 5 MG PO TABS
10.0000 mg | ORAL_TABLET | Freq: Two times a day (BID) | ORAL | Status: DC
  Administered 2016-07-03 – 2016-07-04 (×4): 10 mg via ORAL
  Filled 2016-07-03 (×4): qty 2

## 2016-07-03 NOTE — Progress Notes (Signed)
ANTICOAGULATION CONSULT NOTE - Follow Up Consult  Pharmacy Consult for Apixaban Indication: pulmonary embolus  Labs:  Recent Labs  07/02/16 1045 07/02/16 2015 07/03/16 0237  HGB 15.9  --  13.3  HCT 44.8  --  38.7*  PLT 320  --  270  LABPROT  --   --  15.3*  INR  --   --  1.21  HEPARINUNFRC  --  0.16* 0.39  CREATININE 1.50*  --  1.27*    Assessment: 55yo male previously on heparin now transitioning to apixaban. CBC WNL. RN notes no signs of bleeding.  Goal of Therapy:  Heparin level 0.3-0.7 units/ml   Plan:  Apixaban 10mg  BID x7 days, then 5mg  BID   ), PharmD  [id number] Pharmacy Resident Pager: [phone] 07/03/2016 10:23 AM

## 2016-07-03 NOTE — Progress Notes (Signed)
PROGRESS NOTE                                                                                                                                                                                                             Patient Demographics:    Charles Stevens, is a 56 y.o. male, DOB - 07-19-1960, ZOX:[id number]  Admit date - 07/02/2016   Admitting Physician Pete Glatter, MD  Outpatient Primary MD for the patient is WEBB, Estill Batten, MD  LOS - 1  Chief Complaint  Patient presents with  . Chest Pain    pt c/o chest pain that started about one week ago in his R flank and has since moved o his chest        Brief Narrative   Charles Stevens is a 56 y.o. male with medical history significant for anemia, Barrett's esophagus, ADHD, rheumatoid arthritis, memory deficit, cervical disc disorder with chronic right C7 radiculopathy who presented to the emergency room with complaints of chest pain, was found to have moderate bilateral PE.   Subjective:    Charles Stevens today has, No headache, +ve pleuritic chest pain, No abdominal pain - No Nausea, No new weakness tingling or numbness, No Cough - SOB.     Assessment  & Plan :     1. Moderate bilateral PE with some pulmonary infarct and possible superimposed pneumonia. Kept on heparin drip, hemodynamically stable, will switch to oral Eliquis, check echocardiogram to evaluate for right heart strain, continue empiric antibiotics for pneumonia. Clinically appears nontoxic. Monitor cultures.  2. Multiple spine surgeries. Requested him to follow with his neurosurgeon at [facility] within a week, continue supportive care for now.  3. Hypothyroidism. Continue home dose Synthroid.  4. Mild ARF. Hold NSAIDs renal function stabilized. On low-dose Ace will monitor.  5. Hypertension. Continue combination of beta blocker and ACE inhibitor.  6. Smoking. Counseled to quit. On nicotine  patch.    Diet : Diet Heart Room service appropriate? Yes; Fluid consistency: Thin    Family Communication  :  None  Code Status :  Full  Disposition Plan  :  TBD  Consults  :  None  Procedures  :    CTA - Bilateral PE  TTE -   DVT Prophylaxis  :   Heparin/Eliquis  Lab Results  Component Value  Date   PLT 270 07/03/2016    Inpatient Medications  Scheduled Meds: . aspirin  81 mg Oral Daily  . atomoxetine  100 mg Oral Daily  . azithromycin  500 mg Intravenous Q24H  . baclofen  10-20 mg Oral TID  . buPROPion  150 mg Oral Q breakfast  . cefTRIAXone (ROCEPHIN)  IV  1 g Intravenous Q24H  . levothyroxine  50 mcg Oral QAC breakfast  . lisinopril  20 mg Oral Daily  . mirtazapine  30 mg Oral QHS  . nicotine  14 mg Transdermal Daily  . pramipexole  0.75 mg Oral QHS  . pregabalin  150 mg Oral TID  . propranolol  40 mg Oral QHS  . sodium chloride flush  3 mL Intravenous Q12H   Continuous Infusions: . heparin 1,900 Units/hr (07/03/16 0442)   PRN Meds:.lip balm, ondansetron **OR** ondansetron (ZOFRAN) IV, oxyCODONE, polyethylene glycol  Antibiotics  :    Anti-infectives    Start     Dose/Rate Route Frequency Ordered Stop   07/02/16 1500  cefTRIAXone (ROCEPHIN) 1 g in dextrose 5 % 50 mL IVPB     1 g 100 mL/hr over 30 Minutes Intravenous Every 24 hours 07/02/16 1459 07/09/16 1459   07/02/16 1500  azithromycin (ZITHROMAX) 500 mg in dextrose 5 % 250 mL IVPB     500 mg 250 mL/hr over 60 Minutes Intravenous Every 24 hours 07/02/16 1459 07/09/16 1459         Objective:   Vitals:   07/03/16 0029 07/03/16 0302 07/03/16 0605 07/03/16 0814  BP: 111/75  94/66 104/65  Pulse: 89  91 95  Resp: 17  18 18   Temp: 99.5 F (37.5 C)  98.8 F (37.1 C) (!) 100.6 F (38.1 C)  TempSrc:    Oral  SpO2: 97%  95% 96%  Weight:  86.8 kg (191 lb 6.4 oz)    Height:        Wt Readings from Last 3 Encounters:  07/03/16 86.8 kg (191 lb 6.4 oz)  06/09/16 86.2 kg (190 lb)  11/19/15 86.2  kg (190 lb)     Intake/Output Summary (Last 24 hours) at 07/03/16 1009 Last data filed at 07/03/16 0031  Gross per 24 hour  Intake             1840 ml  Output              550 ml  Net             1290 ml     Physical Exam  Awake Alert, Oriented X 3, No new F.N deficits, extremely dramatic affect Charles Stevens,PERRAL Supple Neck,No JVD, No cervical lymphadenopathy appriciated.  Symmetrical Chest wall movement, Good air movement bilaterally, CTAB RRR,No Gallops,Rubs or new Murmurs, No Parasternal Heave +ve B.Sounds, Abd Soft, No tenderness, No organomegaly appriciated, No rebound - guarding or rigidity. No Cyanosis, Clubbing or edema, No new Rash or bruise       Data Review:    CBC  Recent Labs Lab 07/02/16 1045 07/03/16 0237  WBC 13.7* 14.3*  HGB 15.9 13.3  HCT 44.8 38.7*  PLT 320 270  MCV 93.7 94.6  MCH 33.3 32.5  MCHC 35.5 34.4  RDW 14.0 14.0    Chemistries   Recent Labs Lab 07/02/16 1045 07/03/16 0237  NA 137 132*  K 4.1 4.1  CL 104 100*  CO2 19* 21*  GLUCOSE 111* 117*  BUN 19 16  CREATININE 1.50* 1.27*  CALCIUM 9.4 8.7*   ------------------------------------------------------------------------------------------------------------------  No results for input(s): CHOL, HDL, LDLCALC, TRIG, CHOLHDL, LDLDIRECT in the last 72 hours.  No results found for: HGBA1C ------------------------------------------------------------------------------------------------------------------ No results for input(s): TSH, T4TOTAL, T3FREE, THYROIDAB in the last 72 hours.  Invalid input(s): FREET3 ------------------------------------------------------------------------------------------------------------------ No results for input(s): VITAMINB12, FOLATE, FERRITIN, TIBC, IRON, RETICCTPCT in the last 72 hours.  Coagulation profile  Recent Labs Lab 07/03/16 0237  INR 1.21    No results for input(s): DDIMER in the last 72 hours.  Cardiac Enzymes No results for input(s):  CKMB, TROPONINI, MYOGLOBIN in the last 168 hours.  Invalid input(s): CK ------------------------------------------------------------------------------------------------------------------    Component Value Date/Time   BNP 23.3 07/02/2016 1045    Micro Results Recent Results (from the past 240 hour(s))  MRSA PCR Screening     Status: None   Collection Time: 07/02/16  4:08 PM  Result Value Ref Range Status   MRSA by PCR NEGATIVE NEGATIVE Final    Comment:        The GeneXpert MRSA Assay (FDA approved for NASAL specimens only), is one component of a comprehensive MRSA colonization surveillance program. It is not intended to diagnose MRSA infection nor to guide or monitor treatment for MRSA infections.     Radiology Reports Dg Chest 2 View  Result Date: 07/02/2016 CLINICAL DATA:  Patient with centralized chest pain. Shortness of breath. EXAM: CHEST  2 VIEW COMPARISON:  Chest radiograph 06/09/2016 FINDINGS: Cervical spinal hardware. Normal cardiac and mediastinal contours. Interval development of consolidation within the right lower lobe. Small right pleural effusion. Minimal left basilar atelectasis. Thoracic spine degenerative changes. IMPRESSION: New consolidation within the right lower lobe with small right pleural effusion, concerning for pneumonia in the appropriate clinical setting. Followup PA and lateral chest X-ray is recommended in 3-4 weeks following trial of antibiotic therapy to ensure resolution and exclude underlying malignancy. Electronically Signed   By: Annia Belt M.D.   On: 07/02/2016 11:06   Ct Angio Chest Pe W And/or Wo Contrast  Result Date: 07/02/2016 CLINICAL DATA:  Shortness of breath and pleuritic chest pain. EXAM: CT ANGIOGRAPHY CHEST WITH CONTRAST TECHNIQUE: Multidetector CT imaging of the chest was performed using the standard protocol during bolus administration of intravenous contrast. Multiplanar CT image reconstructions and MIPs were obtained to  evaluate the vascular anatomy. CONTRAST:  80 mL Isovue 370 COMPARISON:  Chest radiograph 07/02/2016.  Chest CT 04/07/2015 FINDINGS: Cardiovascular: Positive for bilateral pulmonary emboli. Small amount of clot extending into left upper lobe pulmonary arteries. Moderate amount of clot in the right lung involving the right upper, right middle and right lower lobe. The largest clot burden is in the right lower lobe segmental branches. RV: LV ratio is approximately 1. Ascending thoracic aorta is mildly aneurysmal measuring up to 4.1 cm. Few atherosclerotic calcifications at the aortic arch. Mediastinum/Nodes: Esophagus is unremarkable. There may be a small amount of residual thymic tissue which is similar to the previous examination. No significant mediastinal or hilar lymphadenopathy. No axillary lymphadenopathy. No significant pericardial fluid. Lungs/Pleura: Small right pleural effusion. Atelectasis and consolidation in the right lower lobe. Peripheral opacities in the lateral right upper lobe on sequence 8, image 60 may be associated with pulmonary infarct. Consolidation and peripheral opacities in the right middle lobe which are also concerning for areas of infarct. Patchy parenchymal densities in left lower lobe most likely associated with atelectasis. Presumed atelectasis in the lingula. Upper Abdomen: Images of the upper abdomen are unremarkable. Musculoskeletal: Query old fracture involving the right lateral eighth rib. Surgical hardware in  the lower cervical spine and upper thoracic spine. Review of the MIP images confirms the above findings. IMPRESSION: Positive for bilateral pulmonary emboli. Moderate clot burden in the right lung, particularly in the right lower lobe. Volume loss and consolidation in the right lower lobe with areas of infarct in the right lung. Small clot burden on the left side of the chest. Difficult to assess for right heart strain due to the orientation of the heart. The RV:LV ratio is  approximately 1 and findings are equivocal for heart strain. Recommend correlation with echocardiogram. Small right pleural effusion. Aneurysmal dilatation of the ascending thoracic aorta measuring up to 4.1 cm. Recommend annual imaging followup by CTA or MRA. This recommendation follows 2010 ACCF/AHA/AATS/ACR/ASA/SCA/SCAI/SIR/STS/SVM Guidelines for the Diagnosis and Management of Patients with Thoracic Aortic Disease. Circulation. 2010; 121: I144-R154 . Critical Value/emergent results were called by telephone at the time of interpretation on 07/02/2016 at 12:43 pm to Dr. Marily Memos , who verbally acknowledged these results. Electronically Signed   By: Richarda Overlie M.D.   On: 07/02/2016 12:50   Mr Lumbar Spine Wo Contrast  Result Date: 06/09/2016 CLINICAL DATA:  56 y/o M; progressive back pain and history of multiple back surgeries. EXAM: MRI LUMBAR SPINE WITHOUT CONTRAST TECHNIQUE: Multiplanar, multisequence MR imaging of the lumbar spine was performed. No intravenous contrast was administered. COMPARISON:  09/15/2015 CT lumbar spine.  10/04/2014 lumbar MRI. FINDINGS: Segmentation:  Standard. Alignment: Mild lumbar levocurvature with apex at L3. Normal lumbar lordosis without listhesis. Vertebrae: No fracture, evidence of discitis, or bone lesion. Degenerative facet edema at the right L4-5 and left L4-5/L5-S1 levels. Conus medullaris: Extends to the T12-L1 level and appears normal. Paraspinal and other soft tissues: Negative. Disc levels: L1-2: Stable small disc bulge without significant foraminal narrowing or canal stenosis. L2-3: Small disc bulge with new right foraminal/subarticular disc protrusion and mild facet hypertrophy. Mild right foraminal and lateral recess narrowing with contact upon the descending right L3 nerve root. L3-4: Stable small disc bulge and right foraminal posterior marginal osteophytes with mild facet hypertrophy. Mild right foraminal and lateral recess narrowing. No significant canal  stenosis. L4-5: Stable moderate disc bulge with moderate facet and ligamentum flavum hypertrophy greater on the left. Moderate left and mild right foraminal narrowing. Severe canal stenosis. L5-S1: Stable disc bulge eccentric to the left with left-sided foraminal and extraforaminal marginal osteophytes. Severe left and moderate right facet hypertrophy and moderate bilateral ligamentum flavum hypertrophy. Stable severe left and mild right foraminal narrowing. Mild canal stenosis. Left-greater-than-right lateral recess effacement with contact upon the descending left S1 nerve root. IMPRESSION: 1. Lumbar spondylosis is largely stable with mild progression at the L2-3 level. 2. Degenerative facet edema at the right L4-5 and left L4-5/L5-S1 levels. 3. Stable multifactorial severe canal stenosis at L4-5. 4. Stable multilevel mild foraminal narrowing with moderate left foraminal narrowing at L4-5 and severe left foraminal narrowing at L5-S1. 5. New right L2-3 foraminal/subarticular disc protrusion with contact upon the descending right L3 nerve root in the lateral recess. 6. Stable disc and facet disease effacing the left lateral recess at L5-S1 with contact upon descending left S1 nerve root. Electronically Signed   By: Mitzi Hansen M.D.   On: 06/09/2016 20:28   Dg Chest Port 1 View  Result Date: 06/09/2016 CLINICAL DATA:  Nausea, epigastric pain and emesis times 15 days. EXAM: PORTABLE CHEST 1 VIEW COMPARISON:  CXR 12/16/2015 FINDINGS: Heart size is normal. The thoracic aorta is tortuous with atherosclerosis. No aneurysm. Mild interstitial prominence is  noted bilaterally which may reflect a component mild interstitial edema or bronchitic change. Partially visualized lower cervical and upper thoracic fusion hardware is noted as before. IMPRESSION: Mild interstitial prominence bilaterally which may reflect bronchitic change or potentially mild interstitial edema. No pneumonic consolidation, effusion or  pneumothorax. Aortic atherosclerosis. Electronically Signed   By: Tollie Eth M.D.   On: 06/09/2016 18:26    Time Spent in minutes  30   SINGH,PRASHANT K M.D on 07/03/2016 at 10:09 AM  Between 7am to 7pm - Pager - [phone] ( page via [facility], text pages only, please mention full 10 digit call back number).  After 7pm go to [email or web address] - password [facility]  [facility] -  Office  [phone]

## 2016-07-03 NOTE — Progress Notes (Signed)
ANTICOAGULATION CONSULT NOTE - Follow Up Consult  Pharmacy Consult for heparin Indication: pulmonary embolus  Labs:  Recent Labs  07/02/16 1045 07/02/16 2015 07/03/16 0237  HGB 15.9  --  13.3  HCT 44.8  --  38.7*  PLT 320  --  270  LABPROT  --   --  15.3*  INR  --   --  1.21  HEPARINUNFRC  --  0.16* 0.39  CREATININE 1.50*  --  1.27*    Assessment: 55yo male now therapeutic on heparin though at low end of goal, would prefer higher levels w/ acute PE; Hgb trending down, RN notes no signs of bleeding.  Goal of Therapy:  Heparin level 0.3-0.7 units/ml   Plan:  Will increase heparin gtt by ~10% to 1900 units/hr and check level in 6hr.  Vernard Gambles, PharmD, BCPS  07/03/2016,4:06 AM

## 2016-07-04 ENCOUNTER — Inpatient Hospital Stay (HOSPITAL_COMMUNITY): Payer: Self-pay

## 2016-07-04 ENCOUNTER — Inpatient Hospital Stay (HOSPITAL_COMMUNITY): Payer: BLUE CROSS/BLUE SHIELD

## 2016-07-04 DIAGNOSIS — I509 Heart failure, unspecified: Secondary | ICD-10-CM

## 2016-07-04 DIAGNOSIS — K2270 Barrett's esophagus without dysplasia: Secondary | ICD-10-CM | POA: Diagnosis not present

## 2016-07-04 DIAGNOSIS — Z86711 Personal history of pulmonary embolism: Secondary | ICD-10-CM

## 2016-07-04 LAB — C DIFFICILE QUICK SCREEN W PCR REFLEX
C DIFFICILE (CDIFF) INTERP: NOT DETECTED
C DIFFICILE (CDIFF) TOXIN: NEGATIVE
C DIFFICLE (CDIFF) ANTIGEN: NEGATIVE

## 2016-07-04 LAB — BASIC METABOLIC PANEL
Anion gap: 9 (ref 5–15)
BUN: 8 mg/dL (ref 6–20)
CHLORIDE: 103 mmol/L (ref 101–111)
CO2: 27 mmol/L (ref 22–32)
CREATININE: 1.08 mg/dL (ref 0.61–1.24)
Calcium: 9 mg/dL (ref 8.9–10.3)
GFR calc non Af Amer: >60 mL/min (ref >60)
Glucose, Bld: 108 mg/dL — ABNORMAL HIGH (ref 65–99)
Potassium: 3.7 mmol/L (ref 3.5–5.1)
Sodium: 139 mmol/L (ref 135–145)

## 2016-07-04 LAB — HOMOCYSTEINE: Homocysteine: 9.6 umol/L (ref 0.0–15.0)

## 2016-07-04 LAB — ECHOCARDIOGRAM COMPLETE
Height: 71.5 in
WEIGHTICAEL: 3057.6 [oz_av]

## 2016-07-04 MED ORDER — APIXABAN 5 MG PO TABS: 10.0000 mg | ORAL_TABLET | Freq: Two times a day (BID) | ORAL | 0 refills | Status: DC

## 2016-07-04 MED ORDER — APIXABAN 5 MG PO TABS: 5.0000 mg | ORAL_TABLET | Freq: Two times a day (BID) | ORAL | 0 refills | Status: AC

## 2016-07-04 MED ORDER — LEVOFLOXACIN 750 MG PO TABS: 750.0000 mg | ORAL_TABLET | Freq: Every day | ORAL | 0 refills | Status: DC

## 2016-07-04 MED ORDER — AZITHROMYCIN 250 MG PO TABS
500.0000 mg | ORAL_TABLET | Freq: Every day | ORAL | Status: DC
  Administered 2016-07-04: 500 mg via ORAL
  Filled 2016-07-04: qty 2

## 2016-07-04 NOTE — Progress Notes (Addendum)
PROGRESS NOTE                                                                                                                                                                                                             Patient Demographics:    Charles Stevens, is a 56 y.o. male, DOB - 1960/05/22, PZW:[id number]  Admit date - 07/02/2016   Admitting Physician Pete Glatter, MD  Outpatient Primary MD for the patient is WEBB, Estill Batten, MD  LOS - 2  Chief Complaint  Patient presents with  . Chest Pain    pt c/o chest pain that started about one week ago in his R flank and has since moved o his chest        Brief Narrative   Charles Stevens is a 56 y.o. male with medical history significant for anemia, Barrett's esophagus, ADHD, rheumatoid arthritis, memory deficit, cervical disc disorder with chronic right C7 radiculopathy who presented to the emergency room with complaints of chest pain, was found to have moderate bilateral PE.   Subjective:    Charles Stevens today has, No headache, +ve pleuritic chest pain, No abdominal pain - No Nausea, No new weakness tingling or numbness, No Cough - much better SOB.     Assessment  & Plan :     1. Moderate bilateral PE with some pulmonary infarct and possible superimposed pneumonia. Kept on heparin drip, hemodynamically stable, will switch to oral Eliquis, pending echocardiogram & Leg [place] to evaluate for right heart strain, continue empiric antibiotics for pneumonia. Clinically appears nontoxic. Monitor cultures.  2. Multiple spine surgeries. Requested him to follow with his neurosurgeon at [facility] within a week, continue supportive care for now.  3. Hypothyroidism. Continue home dose Synthroid.  4. Mild ARF. Hold NSAIDs renal function stabilized. On low-dose Ace will monitor.  5. Hypertension. Continue combination of beta blocker and ACE inhibitor.  6. Smoking. Counseled to quit. On  nicotine patch.  7. Mild Diarrhea - H/O C Diff- rule out.    Diet : Diet Heart Room service appropriate? Yes; Fluid consistency: Thin    Family Communication  :  None  Code Status :  Full  Disposition Plan  :  TBD  Consults  :  None  Procedures  :    CTA - Bilateral PE  Leg [place] -  TTE -   DVT Prophylaxis  :   Heparin/Eliquis  Lab Results  Component Value Date   PLT 270 07/03/2016    Inpatient Medications  Scheduled Meds: . apixaban  10 mg Oral BID   Followed by  . [START ON 07/10/2016] apixaban  5 mg Oral BID  . aspirin  81 mg Oral Daily  . atomoxetine  100 mg Oral Daily  . azithromycin  500 mg Oral Daily  . baclofen  10-20 mg Oral TID  . buPROPion  150 mg Oral Q breakfast  . cefTRIAXone (ROCEPHIN)  IV  1 g Intravenous Q24H  . levothyroxine  50 mcg Oral QAC breakfast  . lisinopril  20 mg Oral Daily  . mirtazapine  30 mg Oral QHS  . nicotine  14 mg Transdermal Daily  . pramipexole  0.75 mg Oral QHS  . pregabalin  150 mg Oral TID  . propranolol  40 mg Oral QHS  . sodium chloride flush  3 mL Intravenous Q12H   Continuous Infusions:  PRN Meds:.lip balm, ondansetron **OR** ondansetron (ZOFRAN) IV, oxyCODONE, polyethylene glycol  Antibiotics  :    Anti-infectives    Start     Dose/Rate Route Frequency Ordered Stop   07/04/16 1230  azithromycin (ZITHROMAX) tablet 500 mg     500 mg Oral Daily 07/04/16 1125     07/04/16 0000  levofloxacin (LEVAQUIN) 750 MG tablet     750 mg Oral Daily 07/04/16 0944     07/02/16 1500  cefTRIAXone (ROCEPHIN) 1 g in dextrose 5 % 50 mL IVPB     1 g 100 mL/hr over 30 Minutes Intravenous Every 24 hours 07/02/16 1459 07/09/16 1459   07/02/16 1500  azithromycin (ZITHROMAX) 500 mg in dextrose 5 % 250 mL IVPB  Status:  Discontinued     500 mg 250 mL/hr over 60 Minutes Intravenous Every 24 hours 07/02/16 1459 07/04/16 1125         Objective:   Vitals:   07/03/16 1939 07/03/16 2344 07/04/16 0516 07/04/16 0801  BP: 125/87 112/73  120/73 118/70  Pulse: 95 84 83 81  Resp:  19 20 18   Temp:  98.8 F (37.1 C) 98.7 F (37.1 C) 98.6 F (37 C)  TempSrc:    Oral  SpO2: 98% 94% 92% 95%  Weight:   86.7 kg (191 lb 1.6 oz)   Height:        Wt Readings from Last 3 Encounters:  07/04/16 86.7 kg (191 lb 1.6 oz)  06/09/16 86.2 kg (190 lb)  11/19/15 86.2 kg (190 lb)     Intake/Output Summary (Last 24 hours) at 07/04/16 1302 Last data filed at 07/04/16 0900  Gross per 24 hour  Intake             2565 ml  Output             2600 ml  Net              -35 ml     Physical Exam  Awake Alert, Oriented X 3, No new F.N deficits, extremely dramatic affect [place].AT,PERRAL Supple Neck,No JVD, No cervical lymphadenopathy appriciated.  Symmetrical Chest wall movement, Good air movement bilaterally, CTAB RRR,No Gallops,Rubs or new Murmurs, No Parasternal Heave +ve B.Sounds, Abd Soft, No tenderness, No organomegaly appriciated, No rebound - guarding or rigidity. No Cyanosis, Clubbing or edema, No new Rash or bruise       Data Review:    CBC  Recent Labs Lab 07/02/16  1045 07/03/16 0237  WBC 13.7* 14.3*  HGB 15.9 13.3  HCT 44.8 38.7*  PLT 320 270  MCV 93.7 94.6  MCH 33.3 32.5  MCHC 35.5 34.4  RDW 14.0 14.0    Chemistries   Recent Labs Lab 07/02/16 1045 07/03/16 0237 07/04/16 0350  NA 137 132* 139  K 4.1 4.1 3.7  CL 104 100* 103  CO2 19* 21* 27  GLUCOSE 111* 117* 108*  BUN 19 16 8   CREATININE 1.50* 1.27* 1.08  CALCIUM 9.4 8.7* 9.0   ------------------------------------------------------------------------------------------------------------------ No results for input(s): CHOL, HDL, LDLCALC, TRIG, CHOLHDL, LDLDIRECT in the last 72 hours.  No results found for: HGBA1C ------------------------------------------------------------------------------------------------------------------ No results for input(s): TSH, T4TOTAL, T3FREE, THYROIDAB in the last 72 hours.  Invalid input(s):  FREET3 ------------------------------------------------------------------------------------------------------------------ No results for input(s): VITAMINB12, FOLATE, FERRITIN, TIBC, IRON, RETICCTPCT in the last 72 hours.  Coagulation profile  Recent Labs Lab 07/03/16 0237  INR 1.21    No results for input(s): DDIMER in the last 72 hours.  Cardiac Enzymes No results for input(s): CKMB, TROPONINI, MYOGLOBIN in the last 168 hours.  Invalid input(s): CK ------------------------------------------------------------------------------------------------------------------    Component Value Date/Time   BNP 23.3 07/02/2016 1045    Micro Results Recent Results (from the past 240 hour(s))  Culture, blood (routine x 2) Call MD if unable to obtain prior to antibiotics being given     Status: None (Preliminary result)   Collection Time: 07/02/16  3:00 PM  Result Value Ref Range Status   Specimen Description BLOOD RIGHT ANTECUBITAL  Final   Special Requests BOTTLES DRAWN AEROBIC AND ANAEROBIC 5CC  Final   Culture NO GROWTH < 24 HOURS  Final   Report Status PENDING  Incomplete  Culture, blood (routine x 2) Call MD if unable to obtain prior to antibiotics being given     Status: None (Preliminary result)   Collection Time: 07/02/16  3:25 PM  Result Value Ref Range Status   Specimen Description BLOOD LEFT HAND  Final   Special Requests AEROBIC BOTTLE ONLY 6CC  Final   Culture NO GROWTH < 24 HOURS  Final   Report Status PENDING  Incomplete  MRSA PCR Screening     Status: None   Collection Time: 07/02/16  4:08 PM  Result Value Ref Range Status   MRSA by PCR NEGATIVE NEGATIVE Final    Comment:        The GeneXpert MRSA Assay (FDA approved for NASAL specimens only), is one component of a comprehensive MRSA colonization surveillance program. It is not intended to diagnose MRSA infection nor to guide or monitor treatment for MRSA infections.     Radiology Reports Dg Chest 2  View  Result Date: 07/02/2016 CLINICAL DATA:  Patient with centralized chest pain. Shortness of breath. EXAM: CHEST  2 VIEW COMPARISON:  Chest radiograph 06/09/2016 FINDINGS: Cervical spinal hardware. Normal cardiac and mediastinal contours. Interval development of consolidation within the right lower lobe. Small right pleural effusion. Minimal left basilar atelectasis. Thoracic spine degenerative changes. IMPRESSION: New consolidation within the right lower lobe with small right pleural effusion, concerning for pneumonia in the appropriate clinical setting. Followup PA and lateral chest X-ray is recommended in 3-4 weeks following trial of antibiotic therapy to ensure resolution and exclude underlying malignancy. Electronically Signed   By: Annia Belt M.D.   On: 07/02/2016 11:06   Ct Angio Chest Pe W And/or Wo Contrast  Result Date: 07/02/2016 CLINICAL DATA:  Shortness of breath and pleuritic chest pain. EXAM: CT  ANGIOGRAPHY CHEST WITH CONTRAST TECHNIQUE: Multidetector CT imaging of the chest was performed using the standard protocol during bolus administration of intravenous contrast. Multiplanar CT image reconstructions and MIPs were obtained to evaluate the vascular anatomy. CONTRAST:  80 mL Isovue 370 COMPARISON:  Chest radiograph 07/02/2016.  Chest CT 04/07/2015 FINDINGS: Cardiovascular: Positive for bilateral pulmonary emboli. Small amount of clot extending into left upper lobe pulmonary arteries. Moderate amount of clot in the right lung involving the right upper, right middle and right lower lobe. The largest clot burden is in the right lower lobe segmental branches. RV: LV ratio is approximately 1. Ascending thoracic aorta is mildly aneurysmal measuring up to 4.1 cm. Few atherosclerotic calcifications at the aortic arch. Mediastinum/Nodes: Esophagus is unremarkable. There may be a small amount of residual thymic tissue which is similar to the previous examination. No significant mediastinal or hilar  lymphadenopathy. No axillary lymphadenopathy. No significant pericardial fluid. Lungs/Pleura: Small right pleural effusion. Atelectasis and consolidation in the right lower lobe. Peripheral opacities in the lateral right upper lobe on sequence 8, image 60 may be associated with pulmonary infarct. Consolidation and peripheral opacities in the right middle lobe which are also concerning for areas of infarct. Patchy parenchymal densities in left lower lobe most likely associated with atelectasis. Presumed atelectasis in the lingula. Upper Abdomen: Images of the upper abdomen are unremarkable. Musculoskeletal: Query old fracture involving the right lateral eighth rib. Surgical hardware in the lower cervical spine and upper thoracic spine. Review of the MIP images confirms the above findings. IMPRESSION: Positive for bilateral pulmonary emboli. Moderate clot burden in the right lung, particularly in the right lower lobe. Volume loss and consolidation in the right lower lobe with areas of infarct in the right lung. Small clot burden on the left side of the chest. Difficult to assess for right heart strain due to the orientation of the heart. The RV:LV ratio is approximately 1 and findings are equivocal for heart strain. Recommend correlation with echocardiogram. Small right pleural effusion. Aneurysmal dilatation of the ascending thoracic aorta measuring up to 4.1 cm. Recommend annual imaging followup by CTA or MRA. This recommendation follows 2010 ACCF/AHA/AATS/ACR/ASA/SCA/SCAI/SIR/STS/SVM Guidelines for the Diagnosis and Management of Patients with Thoracic Aortic Disease. Circulation. 2010; 121: V371-G626 . Critical Value/emergent results were called by telephone at the time of interpretation on 07/02/2016 at 12:43 pm to Dr. Marily Memos , who verbally acknowledged these results. Electronically Signed   By: Richarda Overlie M.D.   On: 07/02/2016 12:50   Mr Lumbar Spine Wo Contrast  Result Date: 06/09/2016 CLINICAL DATA:   56 y/o M; progressive back pain and history of multiple back surgeries. EXAM: MRI LUMBAR SPINE WITHOUT CONTRAST TECHNIQUE: Multiplanar, multisequence MR imaging of the lumbar spine was performed. No intravenous contrast was administered. COMPARISON:  09/15/2015 CT lumbar spine.  10/04/2014 lumbar MRI. FINDINGS: Segmentation:  Standard. Alignment: Mild lumbar levocurvature with apex at L3. Normal lumbar lordosis without listhesis. Vertebrae: No fracture, evidence of discitis, or bone lesion. Degenerative facet edema at the right L4-5 and left L4-5/L5-S1 levels. Conus medullaris: Extends to the T12-L1 level and appears normal. Paraspinal and other soft tissues: Negative. Disc levels: L1-2: Stable small disc bulge without significant foraminal narrowing or canal stenosis. L2-3: Small disc bulge with new right foraminal/subarticular disc protrusion and mild facet hypertrophy. Mild right foraminal and lateral recess narrowing with contact upon the descending right L3 nerve root. L3-4: Stable small disc bulge and right foraminal posterior marginal osteophytes with mild facet hypertrophy. Mild right  foraminal and lateral recess narrowing. No significant canal stenosis. L4-5: Stable moderate disc bulge with moderate facet and ligamentum flavum hypertrophy greater on the left. Moderate left and mild right foraminal narrowing. Severe canal stenosis. L5-S1: Stable disc bulge eccentric to the left with left-sided foraminal and extraforaminal marginal osteophytes. Severe left and moderate right facet hypertrophy and moderate bilateral ligamentum flavum hypertrophy. Stable severe left and mild right foraminal narrowing. Mild canal stenosis. Left-greater-than-right lateral recess effacement with contact upon the descending left S1 nerve root. IMPRESSION: 1. Lumbar spondylosis is largely stable with mild progression at the L2-3 level. 2. Degenerative facet edema at the right L4-5 and left L4-5/L5-S1 levels. 3. Stable multifactorial  severe canal stenosis at L4-5. 4. Stable multilevel mild foraminal narrowing with moderate left foraminal narrowing at L4-5 and severe left foraminal narrowing at L5-S1. 5. New right L2-3 foraminal/subarticular disc protrusion with contact upon the descending right L3 nerve root in the lateral recess. 6. Stable disc and facet disease effacing the left lateral recess at L5-S1 with contact upon descending left S1 nerve root. Electronically Signed   By: Mitzi Hansen M.D.   On: 06/09/2016 20:28   Dg Chest Port 1 View  Result Date: 06/09/2016 CLINICAL DATA:  Nausea, epigastric pain and emesis times 15 days. EXAM: PORTABLE CHEST 1 VIEW COMPARISON:  CXR 12/16/2015 FINDINGS: Heart size is normal. The thoracic aorta is tortuous with atherosclerosis. No aneurysm. Mild interstitial prominence is noted bilaterally which may reflect a component mild interstitial edema or bronchitic change. Partially visualized lower cervical and upper thoracic fusion hardware is noted as before. IMPRESSION: Mild interstitial prominence bilaterally which may reflect bronchitic change or potentially mild interstitial edema. No pneumonic consolidation, effusion or pneumothorax. Aortic atherosclerosis. Electronically Signed   By: Tollie Eth M.D.   On: 06/09/2016 18:26    Time Spent in minutes  30   Nettye Flegal K M.D on 07/04/2016 at 1:02 PM  Between 7am to 7pm - Pager - [phone] ( page via [facility], text pages only, please mention full 10 digit call back number).  After 7pm go to [email or web address] - password [facility]  [facility] -  Office  [phone]

## 2016-07-04 NOTE — Discharge Summary (Signed)
Charles Stevens:[id number] DOB: Jul 16, 1960 DOA: 07/02/2016  PCP: Frederich Chick, MD  Admit date: 07/02/2016  Discharge date: 07/04/2016  Admitted From: Home  Disposition:  Home   Recommendations for Outpatient Follow-up:   Follow up with PCP in 1-2 weeks  PCP Please obtain BMP/CBC, 2 view CXR in 1week,  (see Discharge instructions)   PCP Please follow up on the following pending results: Follow final culture results   Home Health: None   Equipment/Devices: None  Consultations: None Discharge Condition: Stable   CODE STATUS: Full   Diet Recommendation: Heart Healthy    Chief Complaint  Patient presents with  . Chest Pain    pt c/o chest pain that started about one week ago in his R flank and has since moved o his chest      Brief history of present illness from the day of admission and additional interim summary    Charles Stevens a 55 y.o.malewith medical history significant for anemia, Barrett's esophagus, ADHD, rheumatoid arthritis, memory deficit, cervical disc disorder with chronic right C7 radiculopathy who presented to the emergency room with complaints of chest pain, was found to have moderate bilateral PE.                                                                 Hospital Course   1. Moderate bilateral PE with some pulmonary infarct and possible superimposed pneumonia. Kept on heparin drip, hemodynamically stable, will switch to oral Eliquis, Stable echocardiogram & Leg [place] , continue empiric antibiotics x 4 more days for pneumonia. Clinically appears nontoxic and much improved. So far cultures are negative, request PCP to Monitor final culture results.  2. Multiple spine surgeries. Requested him to follow with his neurosurgeon at [facility] within a week, continue supportive care for  now.  3. Hypothyroidism. Continue home dose Synthroid.  4. Mild ARF. Hold NSAIDs renal function stabilized. On low-dose Ace will monitor.  5. Hypertension. Continue combination of beta blocker and ACE inhibitor.  6. Smoking. Counseled to quit. On nicotine patch.  7. Mild Diarrhea -   ruled out this admission.      Discharge diagnosis     Principal Problem:   Pulmonary embolism (HCC) Active Problems:   Memory loss of unknown cause   Cervical disc disorder with radiculopathy of cervical region   Barrett's esophagus   Hypothyroidism   AKI (acute kidney injury) (HCC)   Chest pain on breathing   Shortness of breath    Discharge instructions    Discharge Instructions    Discharge instructions    Complete by:  As directed    Follow with Primary MD WEBB, Estill Batten, MD and your Neuro surgeon within 7 days   Get CBC, CMP, 2 view Chest X ray checked  by  Primary MD or SNF MD in 5-7 days ( we routinely change or add medications that can affect your baseline labs and fluid status, therefore we recommend that you get the mentioned basic workup next visit with your PCP, your PCP may decide not to get them or add new tests based on their clinical decision)  Activity: As tolerated with Full fall precautions use walker/cane & assistance as needed  Disposition Home    Diet: Heart Healthy    For Heart failure patients - Check your Weight same time everyday, if you gain over 2 pounds, or you develop in leg swelling, experience more shortness of breath or chest pain, call your Primary MD immediately. Follow Cardiac Low Salt Diet and 1.5 lit/day fluid restriction.  On your next visit with your primary care physician please Get Medicines reviewed and adjusted.  Please request your Prim.MD to go over all Hospital Tests and Procedure/Radiological results at the follow up, please get all Hospital records sent to your Prim MD by signing hospital release before you go home.  If you  experience worsening of your admission symptoms, develop shortness of breath, life threatening emergency, suicidal or homicidal thoughts you must seek medical attention immediately by calling 911 or calling your MD immediately  if symptoms less severe.  You Must read complete instructions/literature along with all the possible adverse reactions/side effects for all the Medicines you take and that have been prescribed to you. Take any new Medicines after you have completely understood and accpet all the possible adverse reactions/side effects.   Do not drive, operate heavy machinery, perform activities at heights, swimming or participation in water activities or provide baby sitting services if your were admitted for syncope or siezures until you have seen by Primary MD or a Neurologist and advised to do so again.  Do not drive when taking Pain medications.    Do not take more than prescribed Pain, Sleep and Anxiety Medications  Special Instructions: If you have smoked or chewed Tobacco  in the last 2 yrs please stop smoking, stop any regular Alcohol  and or any Recreational drug use.  Wear Seat belts while driving.   Please note  You were cared for by a hospitalist during your hospital stay. If you have any questions about your discharge medications or the care you received while you were in the hospital after you are discharged, you can call the unit and asked to speak with the hospitalist on call if the hospitalist that took care of you is not available. Once you are discharged, your primary care physician will handle any further medical issues. Please note that NO REFILLS for any discharge medications will be authorized once you are discharged, as it is imperative that you return to your primary care physician (or establish a relationship with a primary care physician if you do not have one) for your aftercare needs so that they can reassess your need for medications and monitor your lab values.    Discharge patient    Complete by:  As directed    Discharge disposition:  01-Home or Self Care   Discharge patient date:  07/04/2016   Increase activity slowly    Complete by:  As directed       Discharge Medications   Allergies as of 07/04/2016   No Known Allergies     Medication List    STOP taking these medications   aspirin 81 MG chewable tablet     TAKE these medications  apixaban 5 MG Tabs tablet Commonly known as:  ELIQUIS Take 2 tablets (10 mg total) by mouth 2 (two) times daily.   apixaban 5 MG Tabs tablet Commonly known as:  ELIQUIS Take 1 tablet (5 mg total) by mouth 2 (two) times daily. Start taking on:  07/10/2016   atomoxetine 100 MG capsule Commonly known as:  STRATTERA Take 100 mg by mouth daily.   baclofen 10 MG tablet Commonly known as:  LIORESAL Take 10-20 mg by mouth 3 (three) times daily.   buPROPion 100 MG 12 hr tablet Commonly known as:  WELLBUTRIN SR Take 150 mg by mouth daily with breakfast.   celecoxib 200 MG capsule Commonly known as:  CELEBREX Take 200 mg by mouth daily.   levofloxacin 750 MG tablet Commonly known as:  LEVAQUIN Take 1 tablet (750 mg total) by mouth daily.   Levothyroxine Sodium 50 MCG Caps Take 50 mcg by mouth daily before breakfast.   lisinopril 20 MG tablet Commonly known as:  PRINIVIL,ZESTRIL Take 20 mg by mouth daily.   mirtazapine 45 MG tablet Commonly known as:  REMERON Take 14-45 mg by mouth at bedtime.   nicotine 14 mg/24hr patch Commonly known as:  NICODERM CQ - dosed in mg/24 hours Place 14 mg onto the skin daily.   ondansetron 4 MG tablet Commonly known as:  ZOFRAN Take 4 mg by mouth 3 (three) times daily as needed for nausea or vomiting.   oxyCODONE 15 MG immediate release tablet Commonly known as:  ROXICODONE Take 15 mg by mouth every 6 (six) hours as needed for pain.   pramipexole 0.25 MG tablet Commonly known as:  MIRAPEX Take 0.75 mg by mouth at bedtime.   pregabalin 150 MG  capsule Commonly known as:  LYRICA Take 150 mg by mouth 3 (three) times daily.   propranolol 40 MG tablet Commonly known as:  INDERAL Take 40 mg by mouth at bedtime.   VOLTAREN 1 % Gel Generic drug:  diclofenac sodium Apply 2 g topically 2 (two) times daily as needed (for pain).       Follow-up Information    WEBB, CAROL D, MD. Schedule an appointment as soon as possible for a visit in 1 week(s).   Specialty:  Family Medicine Why:  and your Neurosurgeon Contact information: [address] [place] [address] [phone]           Major procedures and Radiology Reports - PLEASE review detailed and final reports thoroughly  -      CTA - Bilateral PE  Leg [place] - Preliminary report:  There is no DVT or SVT noted in the bilateral lower extremities.  There is sluggish flow noted bilaterally.  TTE - Left ventricle: The cavity size was normal. There was mild focal basal hypertrophy of the septum. Systolic function was normal. The estimated ejection fraction was in the range of 60% to 65%. Wall motion was normal; there were no regional wall motion abnormalities. Doppler parameters are consistent with abnormal   left ventricular relaxation (grade 1 diastolic dysfunction). - Aortic valve: Transvalvular velocity was within the normal range. There was no stenosis. There was trivial regurgitation.  Mitral valve: Transvalvular velocity was within the normal range. There was no evidence for stenosis. There was no regurgitation. - Right ventricle: The cavity size was normal. Wall thickness was normal. Systolic function was normal. - Tricuspid valve: There was trivial regurgitation. - Pulmonary arteries: Systolic pressure was within the normal range. PA peak pressure: 26  mm Hg (S).    Dg Chest 2 View  Result Date: 07/02/2016 CLINICAL DATA:  Patient with centralized chest pain. Shortness of breath. EXAM: CHEST  2 VIEW COMPARISON:  Chest radiograph 06/09/2016  FINDINGS: Cervical spinal hardware. Normal cardiac and mediastinal contours. Interval development of consolidation within the right lower lobe. Small right pleural effusion. Minimal left basilar atelectasis. Thoracic spine degenerative changes. IMPRESSION: New consolidation within the right lower lobe with small right pleural effusion, concerning for pneumonia in the appropriate clinical setting. Followup PA and lateral chest X-ray is recommended in 3-4 weeks following trial of antibiotic therapy to ensure resolution and exclude underlying malignancy. Electronically Signed   By: Annia Belt M.D.   On: 07/02/2016 11:06   Ct Angio Chest Pe W And/or Wo Contrast  Result Date: 07/02/2016 CLINICAL DATA:  Shortness of breath and pleuritic chest pain. EXAM: CT ANGIOGRAPHY CHEST WITH CONTRAST TECHNIQUE: Multidetector CT imaging of the chest was performed using the standard protocol during bolus administration of intravenous contrast. Multiplanar CT image reconstructions and MIPs were obtained to evaluate the vascular anatomy. CONTRAST:  80 mL Isovue 370 COMPARISON:  Chest radiograph 07/02/2016.  Chest CT 04/07/2015 FINDINGS: Cardiovascular: Positive for bilateral pulmonary emboli. Small amount of clot extending into left upper lobe pulmonary arteries. Moderate amount of clot in the right lung involving the right upper, right middle and right lower lobe. The largest clot burden is in the right lower lobe segmental branches. RV: LV ratio is approximately 1. Ascending thoracic aorta is mildly aneurysmal measuring up to 4.1 cm. Few atherosclerotic calcifications at the aortic arch. Mediastinum/Nodes: Esophagus is unremarkable. There may be a small amount of residual thymic tissue which is similar to the previous examination. No significant mediastinal or hilar lymphadenopathy. No axillary lymphadenopathy. No significant pericardial fluid. Lungs/Pleura: Small right pleural effusion. Atelectasis and consolidation in the right  lower lobe. Peripheral opacities in the lateral right upper lobe on sequence 8, image 60 may be associated with pulmonary infarct. Consolidation and peripheral opacities in the right middle lobe which are also concerning for areas of infarct. Patchy parenchymal densities in left lower lobe most likely associated with atelectasis. Presumed atelectasis in the lingula. Upper Abdomen: Images of the upper abdomen are unremarkable. Musculoskeletal: Query old fracture involving the right lateral eighth rib. Surgical hardware in the lower cervical spine and upper thoracic spine. Review of the MIP images confirms the above findings. IMPRESSION: Positive for bilateral pulmonary emboli. Moderate clot burden in the right lung, particularly in the right lower lobe. Volume loss and consolidation in the right lower lobe with areas of infarct in the right lung. Small clot burden on the left side of the chest. Difficult to assess for right heart strain due to the orientation of the heart. The RV:LV ratio is approximately 1 and findings are equivocal for heart strain. Recommend correlation with echocardiogram. Small right pleural effusion. Aneurysmal dilatation of the ascending thoracic aorta measuring up to 4.1 cm. Recommend annual imaging followup by CTA or MRA. This recommendation follows 2010 ACCF/AHA/AATS/ACR/ASA/SCA/SCAI/SIR/STS/SVM Guidelines for the Diagnosis and Management of Patients with Thoracic Aortic Disease. Circulation. 2010; 121: A213-Y865 . Critical Value/emergent results were called by telephone at the time of interpretation on 07/02/2016 at 12:43 pm to Dr. Marily Memos , who verbally acknowledged these results. Electronically Signed   By: Richarda Overlie M.D.   On: 07/02/2016 12:50   Mr Lumbar Spine Wo Contrast  Result Date: 06/09/2016 CLINICAL DATA:  56 y/o M; progressive back pain and history of  multiple back surgeries. EXAM: MRI LUMBAR SPINE WITHOUT CONTRAST TECHNIQUE: Multiplanar, multisequence MR imaging of the  lumbar spine was performed. No intravenous contrast was administered. COMPARISON:  09/15/2015 CT lumbar spine.  10/04/2014 lumbar MRI. FINDINGS: Segmentation:  Standard. Alignment: Mild lumbar levocurvature with apex at L3. Normal lumbar lordosis without listhesis. Vertebrae: No fracture, evidence of discitis, or bone lesion. Degenerative facet edema at the right L4-5 and left L4-5/L5-S1 levels. Conus medullaris: Extends to the T12-L1 level and appears normal. Paraspinal and other soft tissues: Negative. Disc levels: L1-2: Stable small disc bulge without significant foraminal narrowing or canal stenosis. L2-3: Small disc bulge with new right foraminal/subarticular disc protrusion and mild facet hypertrophy. Mild right foraminal and lateral recess narrowing with contact upon the descending right L3 nerve root. L3-4: Stable small disc bulge and right foraminal posterior marginal osteophytes with mild facet hypertrophy. Mild right foraminal and lateral recess narrowing. No significant canal stenosis. L4-5: Stable moderate disc bulge with moderate facet and ligamentum flavum hypertrophy greater on the left. Moderate left and mild right foraminal narrowing. Severe canal stenosis. L5-S1: Stable disc bulge eccentric to the left with left-sided foraminal and extraforaminal marginal osteophytes. Severe left and moderate right facet hypertrophy and moderate bilateral ligamentum flavum hypertrophy. Stable severe left and mild right foraminal narrowing. Mild canal stenosis. Left-greater-than-right lateral recess effacement with contact upon the descending left S1 nerve root. IMPRESSION: 1. Lumbar spondylosis is largely stable with mild progression at the L2-3 level. 2. Degenerative facet edema at the right L4-5 and left L4-5/L5-S1 levels. 3. Stable multifactorial severe canal stenosis at L4-5. 4. Stable multilevel mild foraminal narrowing with moderate left foraminal narrowing at L4-5 and severe left foraminal narrowing at  L5-S1. 5. New right L2-3 foraminal/subarticular disc protrusion with contact upon the descending right L3 nerve root in the lateral recess. 6. Stable disc and facet disease effacing the left lateral recess at L5-S1 with contact upon descending left S1 nerve root. Electronically Signed   By: Mitzi Hansen M.D.   On: 06/09/2016 20:28   Dg Chest Port 1 View  Result Date: 06/09/2016 CLINICAL DATA:  Nausea, epigastric pain and emesis times 15 days. EXAM: PORTABLE CHEST 1 VIEW COMPARISON:  CXR 12/16/2015 FINDINGS: Heart size is normal. The thoracic aorta is tortuous with atherosclerosis. No aneurysm. Mild interstitial prominence is noted bilaterally which may reflect a component mild interstitial edema or bronchitic change. Partially visualized lower cervical and upper thoracic fusion hardware is noted as before. IMPRESSION: Mild interstitial prominence bilaterally which may reflect bronchitic change or potentially mild interstitial edema. No pneumonic consolidation, effusion or pneumothorax. Aortic atherosclerosis. Electronically Signed   By: Tollie Eth M.D.   On: 06/09/2016 18:26    Micro Results     Recent Results (from the past 240 hour(s))  Culture, blood (routine x 2) Call MD if unable to obtain prior to antibiotics being given     Status: None (Preliminary result)   Collection Time: 07/02/16  3:00 PM  Result Value Ref Range Status   Specimen Description BLOOD RIGHT ANTECUBITAL  Final   Special Requests BOTTLES DRAWN AEROBIC AND ANAEROBIC 5CC  Final   Culture NO GROWTH 2 DAYS  Final   Report Status PENDING  Incomplete  Culture, blood (routine x 2) Call MD if unable to obtain prior to antibiotics being given     Status: None (Preliminary result)   Collection Time: 07/02/16  3:25 PM  Result Value Ref Range Status   Specimen Description BLOOD LEFT HAND  Final  Special Requests AEROBIC BOTTLE ONLY 6CC  Final   Culture NO GROWTH 2 DAYS  Final   Report Status PENDING  Incomplete  MRSA  PCR Screening     Status: None   Collection Time: 07/02/16  4:08 PM  Result Value Ref Range Status   MRSA by PCR NEGATIVE NEGATIVE Final    Comment:        The GeneXpert MRSA Assay (FDA approved for NASAL specimens only), is one component of a comprehensive MRSA colonization surveillance program. It is not intended to diagnose MRSA infection nor to guide or monitor treatment for MRSA infections.   C difficile quick scan w PCR reflex     Status: None   Collection Time: 07/04/16 12:32 PM  Result Value Ref Range Status   C Diff antigen NEGATIVE NEGATIVE Final   C Diff toxin NEGATIVE NEGATIVE Final   C Diff interpretation No C. difficile detected.  Final    Today   Subjective    Nolin Granier today has no headache,no chest abdominal pain,no new weakness tingling or numbness, feels much better wants to go home today.     Objective   Blood pressure 120/74, pulse 80, temperature 98.4 F (36.9 C), temperature source Oral, resp. rate 17, height 5' 11.5" (1.816 m), weight 86.7 kg (191 lb 1.6 oz), SpO2 98 %.   Intake/Output Summary (Last 24 hours) at 07/04/16 1730 Last data filed at 07/04/16 1517  Gross per 24 hour  Intake             2015 ml  Output             2150 ml  Net             -135 ml    Exam Awake Alert, Oriented x 3, No new F.N deficits, Normal affect .AT,PERRAL Supple Neck,No JVD, No cervical lymphadenopathy appriciated.  Symmetrical Chest wall movement, Good air movement bilaterally, CTAB RRR,No Gallops,Rubs or new Murmurs, No Parasternal Heave +ve B.Sounds, Abd Soft, Non tender, No organomegaly appriciated, No rebound -guarding or rigidity. No Cyanosis, Clubbing or edema, No new Rash or bruise   Data Review   CBC w Diff:  Lab Results  Component Value Date   WBC 14.3 (H) 07/03/2016   HGB 13.3 07/03/2016   HCT 38.7 (L) 07/03/2016   PLT 270 07/03/2016   LYMPHOPCT 17 06/09/2016   MONOPCT 7 06/09/2016   EOSPCT 2 06/09/2016   BASOPCT 0 06/09/2016     CMP:  Lab Results  Component Value Date   NA 139 07/04/2016   K 3.7 07/04/2016   CL 103 07/04/2016   CO2 27 07/04/2016   BUN 8 07/04/2016   CREATININE 1.08 07/04/2016   PROT 7.7 06/09/2016   ALBUMIN 4.3 06/09/2016   BILITOT 0.9 06/09/2016   ALKPHOS 65 06/09/2016   AST 30 06/09/2016   ALT 21 06/09/2016  .   Total Time in preparing paper work, data evaluation and todays exam - 35 minutes  Leroy Sea M.D on 07/04/2016 at 5:30 PM  [facility]   Office  [phone]

## 2016-07-04 NOTE — Progress Notes (Signed)
  Echocardiogram 2D Echocardiogram has been performed.  Charles Stevens 07/04/2016, 1:19 PM

## 2016-07-04 NOTE — Progress Notes (Signed)
VASCULAR LAB PRELIMINARY  PRELIMINARY  PRELIMINARY  PRELIMINARY  Bilateral lower extremity venous duplex completed.    Preliminary report:  There is no DVT or SVT noted in the bilateral lower extremities.  There is sluggish flow noted bilaterally.  Charles Stevens, RVT 07/04/2016, 1:33 PM

## 2016-07-04 NOTE — Discharge Instructions (Signed)
Follow with Primary MD WEBB, Estill Batten, MD and your Neuro surgeon within 7 days   Get CBC, CMP, 2 view Chest X ray checked  by Primary MD or SNF MD in 5-7 days ( we routinely change or add medications that can affect your baseline labs and fluid status, therefore we recommend that you get the mentioned basic workup next visit with your PCP, your PCP may decide not to get them or add new tests based on their clinical decision)  Activity: As tolerated with Full fall precautions use walker/cane & assistance as needed  Disposition Home    Diet: Heart Healthy    For Heart failure patients - Check your Weight same time everyday, if you gain over 2 pounds, or you develop in leg swelling, experience more shortness of breath or chest pain, call your Primary MD immediately. Follow Cardiac Low Salt Diet and 1.5 lit/day fluid restriction.  On your next visit with your primary care physician please Get Medicines reviewed and adjusted.  Please request your Prim.MD to go over all Hospital Tests and Procedure/Radiological results at the follow up, please get all Hospital records sent to your Prim MD by signing hospital release before you go home.  If you experience worsening of your admission symptoms, develop shortness of breath, life threatening emergency, suicidal or homicidal thoughts you must seek medical attention immediately by calling 911 or calling your MD immediately  if symptoms less severe.  You Must read complete instructions/literature along with all the possible adverse reactions/side effects for all the Medicines you take and that have been prescribed to you. Take any new Medicines after you have completely understood and accpet all the possible adverse reactions/side effects.   Do not drive, operate heavy machinery, perform activities at heights, swimming or participation in water activities or provide baby sitting services if your were admitted for syncope or siezures until you have seen by  Primary MD or a Neurologist and advised to do so again.  Do not drive when taking Pain medications.    Do not take more than prescribed Pain, Sleep and Anxiety Medications  Special Instructions: If you have smoked or chewed Tobacco  in the last 2 yrs please stop smoking, stop any regular Alcohol  and or any Recreational drug use.  Wear Seat belts while driving.   Please note  You were cared for by a hospitalist during your hospital stay. If you have any questions about your discharge medications or the care you received while you were in the hospital after you are discharged, you can call the unit and asked to speak with the hospitalist on call if the hospitalist that took care of you is not available. Once you are discharged, your primary care physician will handle any further medical issues. Please note that NO REFILLS for any discharge medications will be authorized once you are discharged, as it is imperative that you return to your primary care physician (or establish a relationship with a primary care physician if you do not have one) for your aftercare needs so that they can reassess your need for medications and monitor your lab values.       Apixaban oral tablets What is this medicine? APIXABAN (a PIX a ban) is an anticoagulant (blood thinner). It is used to lower the chance of stroke in people with a medical condition called atrial fibrillation. It is also used to treat or prevent blood clots in the lungs or in the veins. This medicine may be used  for other purposes; ask your health care provider or pharmacist if you have questions. COMMON BRAND NAME(S): Eliquis What should I tell my health care provider before I take this medicine? They need to know if you have any of these conditions: -bleeding disorders -bleeding in the brain -blood in your stools (black or tarry stools) or if you have blood in your vomit -history of stomach bleeding -kidney disease -liver  disease -mechanical heart valve -an unusual or allergic reaction to apixaban, other medicines, foods, dyes, or preservatives -pregnant or trying to get pregnant -breast-feeding How should I use this medicine? Take this medicine by mouth with a glass of water. Follow the directions on the prescription label. You can take it with or without food. If it upsets your stomach, take it with food. Take your medicine at regular intervals. Do not take it more often than directed. Do not stop taking except on your doctor's advice. Stopping this medicine may increase your risk of a blot clot. Be sure to refill your prescription before you run out of medicine. Talk to your pediatrician regarding the use of this medicine in children. Special care may be needed. Overdosage: If you think you have taken too much of this medicine contact a poison control center or emergency room at once. NOTE: This medicine is only for you. Do not share this medicine with others. What if I miss a dose? If you miss a dose, take it as soon as you can. If it is almost time for your next dose, take only that dose. Do not take double or extra doses. What may interact with this medicine? This medicine may interact with the following: -aspirin and aspirin-like medicines -certain medicines for fungal infections like ketoconazole and itraconazole -certain medicines for seizures like carbamazepine and phenytoin -certain medicines that treat or prevent blood clots like warfarin, enoxaparin, and dalteparin -clarithromycin -NSAIDs, medicines for pain and inflammation, like ibuprofen or naproxen -rifampin -ritonavir -St. John's wort This list may not describe all possible interactions. Give your health care provider a list of all the medicines, herbs, non-prescription drugs, or dietary supplements you use. Also tell them if you smoke, drink alcohol, or use illegal drugs. Some items may interact with your medicine. What should I watch for  while using this medicine? Visit your doctor or health care professional for regular checks on your progress. Notify your doctor or health care professional and seek emergency treatment if you develop breathing problems; changes in vision; chest pain; severe, sudden headache; pain, swelling, warmth in the leg; trouble speaking; sudden numbness or weakness of the face, arm or leg. These can be signs that your condition has gotten worse. If you are going to have surgery or other procedure, tell your doctor that you are taking this medicine. What side effects may I notice from receiving this medicine? Side effects that you should report to your doctor or health care professional as soon as possible: -allergic reactions like skin rash, itching or hives, swelling of the face, lips, or tongue -signs and symptoms of bleeding such as bloody or black, tarry stools; red or dark-brown urine; spitting up blood or brown material that looks like coffee grounds; red spots on the skin; unusual bruising or bleeding from the eye, gums, or nose This list may not describe all possible side effects. Call your doctor for medical advice about side effects. You may report side effects to FDA at 1-800-FDA-1088. Where should I keep my medicine? Keep out of the reach of children.  Store at room temperature between 20 and 25 degrees C (68 and 77 degrees F). Throw away any unused medicine after the expiration date. NOTE: This sheet is a summary. It may not cover all possible information. If you have questions about this medicine, talk to your doctor, pharmacist, or health care provider.  2018 Elsevier/Gold Standard (2015-10-19 11:54:23)

## 2016-07-04 NOTE — Progress Notes (Signed)
Nutrition Brief Note  Patient identified on the Malnutrition Screening Tool (MST) Report. Weight seems to have been stable for the past year. Nutrition focused physical exam completed.  No muscle or subcutaneous fat depletion noticed.   Wt Readings from Last 15 Encounters:  07/04/16 191 lb 1.6 oz (86.7 kg)  06/09/16 190 lb (86.2 kg)  11/19/15 190 lb (86.2 kg)  11/05/15 195 lb 8 oz (88.7 kg)  08/26/15 189 lb (85.7 kg)  06/01/15 200 lb (90.7 kg)  04/02/15 200 lb (90.7 kg)  01/14/15 195 lb (88.5 kg)  11/26/14 195 lb (88.5 kg)  09/25/14 202 lb (91.6 kg)  05/22/14 215 lb (97.5 kg)  10/24/13 200 lb (90.7 kg)  09/23/13 198 lb (89.8 kg)  09/20/12 180 lb (81.6 kg)  08/31/12 179 lb 1.6 oz (81.2 kg)    Body mass index is 26.28 kg/m. Patient meets criteria for overweight based on current BMI.   Current diet order is heart healthy, patient is consuming approximately 50-100% of meals at this time. Labs and medications reviewed.   No nutrition interventions warranted at this time. If nutrition issues arise, please consult RD.   Joaquin Courts, RD, LDN, CNSC Pager [phone] After Hours Pager [phone]

## 2016-07-04 NOTE — Care Management (Signed)
1613 06-10-16 Pt is without insurance. Pt is getting a disability check [phone] per month. Pt has PCP @ [facility]. CM did provide pt with 30 day free card for Eliquis. Pt assistance application provided to patient and MD will need to fill out there part. No further needs from CM @ this time.

## 2016-07-05 LAB — PROTEIN S ACTIVITY: Protein S Activity: 95 % (ref 63–140)

## 2016-07-05 LAB — PROTEIN S, TOTAL: PROTEIN S AG TOTAL: 166 % — AB (ref 60–150)

## 2016-07-05 LAB — PROTEIN C ACTIVITY: PROTEIN C ACTIVITY: 130 % (ref 73–180)

## 2016-07-05 LAB — PROTEIN C, TOTAL: Protein C, Total: 105 % (ref 60–150)

## 2016-07-06 LAB — CARDIOLIPIN ANTIBODIES, IGG, IGM, IGA: Anticardiolipin IgM: 10 MPL U/mL (ref 0–12)

## 2016-07-07 LAB — DRVVT CONFIRM: DRVVT CONFIRM: 1.6 ratio — AB (ref 0.8–1.2)

## 2016-07-07 LAB — CULTURE, BLOOD (ROUTINE X 2)
Culture: NO GROWTH
Culture: NO GROWTH

## 2016-07-07 LAB — PROTHROMBIN GENE MUTATION

## 2016-07-07 LAB — LUPUS ANTICOAGULANT PANEL
DRVVT: 79.5 s — ABNORMAL HIGH (ref 0.0–47.0)
PTT Lupus Anticoagulant: 43.8 s (ref 0.0–51.9)

## 2016-07-07 LAB — DRVVT MIX: dRVVT Mix: 53.1 s — ABNORMAL HIGH (ref 0.0–47.0)

## 2016-07-07 LAB — BETA-2-GLYCOPROTEIN I ABS, IGG/M/A: Beta-2 Glyco I IgG: <9 GPI IgG units (ref 0–20)

## 2016-07-08 LAB — FACTOR 5 LEIDEN

## 2016-07-21 DIAGNOSIS — Z0289 Encounter for other administrative examinations: Secondary | ICD-10-CM

## 2016-08-23 ENCOUNTER — Ambulatory Visit (INDEPENDENT_AMBULATORY_CARE_PROVIDER_SITE_OTHER): Payer: Self-pay | Admitting: Internal Medicine

## 2016-08-23 ENCOUNTER — Encounter: Payer: Self-pay | Admitting: Internal Medicine

## 2016-08-23 ENCOUNTER — Other Ambulatory Visit: Payer: Self-pay | Admitting: Internal Medicine

## 2016-08-23 DIAGNOSIS — M545 Low back pain, unspecified: Secondary | ICD-10-CM | POA: Insufficient documentation

## 2016-08-23 LAB — COMPLETE METABOLIC PANEL WITH GFR
ALBUMIN: 4.1 g/dL (ref 3.6–5.1)
ALK PHOS: 64 U/L (ref 40–115)
ALT: 14 U/L (ref 9–46)
AST: 20 U/L (ref 10–35)
BUN: 16 mg/dL (ref 7–25)
CO2: 20 mmol/L (ref 20–31)
CREATININE: 1.29 mg/dL (ref 0.70–1.33)
Calcium: 9.5 mg/dL (ref 8.6–10.3)
Chloride: 108 mmol/L (ref 98–110)
GFR, Est African American: 73 mL/min (ref >=60)
GFR, Est Non African American: 63 mL/min (ref >=60)
GLUCOSE: 89 mg/dL (ref 65–99)
POTASSIUM: 4.7 mmol/L (ref 3.5–5.3)
SODIUM: 140 mmol/L (ref 135–146)
Total Bilirubin: 0.3 mg/dL (ref 0.2–1.2)
Total Protein: 6.9 g/dL (ref 6.1–8.1)

## 2016-08-23 NOTE — Progress Notes (Signed)
[facility]      Reason for Consult: reported fever, recurrent pneumonia, intermittent encephalopathy    Referring Physician: Dr. Andee Poles    Patient ID: Charles Stevens, male    DOB: [birth year], 56 y.o.   MRN: [id number]  HPI:   He comes in for evaluation of above.  According to his referral, he has been havign recurring fever, fatigue and severe neck pain with confusion and came to the PCPs office 'very ill- febrile, weak, and very confused".  Also reported to have recurrent pneumonia and recently was diagnosed with pulmonary embolism.  From the patient, he tells me this started about 2 years ago and mainly has been confusion.  He also brings up a concern that he has osteomyelitis of his lumbar or sacral spine or hip despite recent negative MRI.  He tells me he feels the MRI was negative since he is on chronic azithromycin which 'masks' the MRI findings. His MRI did note a new area at L2-3 of disc protrusion with contact of the right L3 nerve root.  He read that all of his symptoms can be attributable to untreated osteomyelitis/discitis.  He has had injections in his back due to his disc disease.  He reports he has episodes where he is 'septic' if he does not take his azithromycin.  He also feels that his confusion and mental state is better when on azithromycin.  He has a history of rheumatoid arthritis but has not been on Humira for about 2 years.  He intermittently takes his medications and has not taken his Synthroid for a prolonged period.  Does not take Baclofen daily.  Takes remeron nightly and Lyrica daily for the back pain.  Lives in [place] and has not been to an endemic Lyme disease area.  He has seen neurology and had unrevealing neuropsychiatric testing.   MRI independently reviewed and no lytic lesions noted Previous record reviewed from Epic.  He was diagnosed with pneumonia during an ED visit in August 2017 though was virtually asymptomatic from a  pulmonary standpoint with no cough, no sob, no hypoxia and had presented to the ED with headache.  He was also diagnosed with pneumonia during his recent hospital stay for PE.  He had a lumbar puncture in August but no signficant WBCs or culture results. He has had a negative HIV, RPR and numerous other tests.    Past Medical History:  Diagnosis Date  . Anemia   . Aneurysm (HCC)   . Attention deficit disorder   . Barrett's esophagus   . Cervical disc disorder with radiculopathy of cervical region 10/09/2014   Chronic right C7 radiculopathy  . Cervical spondylosis   . Chronic prostatitis    Possible chronic prostatitis  . Dysuria    Hx. of Dysuria  . Foot drop, left   . Memory loss   . Memory loss of unknown cause    dr [name] 2014 . NP Darrol Angel   . Rheumatoid arthritis(714.0)     Prior to Admission medications   Medication Sig Start Date End Date Taking? Authorizing Provider  apixaban (ELIQUIS) 5 MG TABS tablet Take 1 tablet (5 mg total) by mouth 2 (two) times daily. 07/10/16  Yes Leroy Sea, MD  atomoxetine (STRATTERA) 100 MG capsule Take 100 mg by mouth daily.   Yes [provider]  baclofen (LIORESAL) 10 MG tablet Take 10-20 mg by mouth 3 (three) times daily.    Yes [provider]  buPROPion (WELLBUTRIN SR) 100 MG 12 hr tablet Take 150 mg by mouth daily with breakfast.   Yes [provider]  celecoxib (CELEBREX) 200 MG capsule Take 200 mg by mouth daily.   Yes [provider]  lisinopril (PRINIVIL,ZESTRIL) 20 MG tablet Take 20 mg by mouth daily.   Yes [provider]  mirtazapine (REMERON) 45 MG tablet Take 14-45 mg by mouth at bedtime.    Yes [provider]  ondansetron (ZOFRAN) 4 MG tablet Take 4 mg by mouth 3 (three) times daily as needed for nausea or vomiting.    Yes [provider]  oxyCODONE (ROXICODONE) 15 MG immediate release tablet Take 15 mg by mouth every 6 (six) hours as needed for pain.    Yes  [provider]  pramipexole (MIRAPEX) 0.25 MG tablet Take 0.75 mg by mouth at bedtime.   Yes [provider]  pregabalin (LYRICA) 150 MG capsule Take 150 mg by mouth 3 (three) times daily.    Yes [provider]  propranolol (INDERAL) 40 MG tablet Take 40 mg by mouth at bedtime.    Yes [provider]  VOLTAREN 1 % GEL Apply 2 g topically 2 (two) times daily as needed (for pain).  06/12/12  Yes [provider]  apixaban (ELIQUIS) 5 MG TABS tablet Take 2 tablets (10 mg total) by mouth 2 (two) times daily. 07/04/16 07/09/16  Leroy Sea, MD  levofloxacin (LEVAQUIN) 750 MG tablet Take 1 tablet (750 mg total) by mouth daily. Patient not taking: Reported on 08/23/2016 07/04/16   Leroy Sea, MD  Levothyroxine Sodium 50 MCG CAPS Take 50 mcg by mouth daily before breakfast.     [provider]    No Known Allergies  Social History  Substance Use Topics  . Smoking status: Former Smoker    Types: Pipe  . Smokeless tobacco: Never Used     Comment: Quit in April  . Alcohol use No    Family History  Problem Relation Age of Onset  . Cancer Mother        Colon, Breast, Liver  . Varicose Veins Mother   . Hyperlipidemia Mother   . Hyperlipidemia Father   . Hypertension Father   . Heart attack Father   . Diabetes Brother   . Heart disease Brother   . Peripheral vascular disease Brother   . Deep vein thrombosis Brother   . Heart attack Brother     Review of Systems  Constitutional: positive for fevers, fatigue, malaise, anorexia and weight loss or negative for chills Respiratory: negative for cough, sputum or dyspnea on exertion Gastrointestinal: positive for diarrhea, negative for nausea and vomiting Integument/breast: negative for rash Hematologic/lymphatic: positive for lymphadenopathy, negative for easy bruising Musculoskeletal: positive for myalgias, back pain and muscle weakness, negative for arthralgias Neurological:  positive for headaches, memory problems, paresthesia, gait problems and weakness, negative for vertigo All other systems reviewed and are negative    Constitutional: in no apparent distress and alert  Vitals:   08/23/16 1353  BP: (!) 143/98  Pulse: 66  Temp: 97.7 F (36.5 C)   EYES: anicteric ENMT: Cardiovascular: Cor RRR Respiratory: CTA B; normal respiratory effort GI: Bowel sounds are normal, liver is not enlarged, spleen is not enlarged, soft, nt Musculoskeletal: no pedal edema noted Skin: negatives: no rash Hematologic: no cervical, supraclavicular, axillary lad  Labs: Lab Results  Component Value Date   WBC 14.3 (H) 07/03/2016   HGB 13.3 07/03/2016  HCT 38.7 (L) 07/03/2016   MCV 94.6 07/03/2016   PLT 270 07/03/2016    Lab Results  Component Value Date   CREATININE 1.08 07/04/2016   BUN 8 07/04/2016   NA 139 07/04/2016   K 3.7 07/04/2016   CL 103 07/04/2016   CO2 27 07/04/2016    Lab Results  Component Value Date   ALT 21 06/09/2016   AST 30 06/09/2016   ALKPHOS 65 06/09/2016   BILITOT 0.9 06/09/2016   INR 1.21 07/03/2016     Assessment: He has an unclear picture of somewhat intermittent confusion, memory issues, subjective fever, back/hip pain that he feels is only relieved with daily azithromycin. I had an extensive discussion with him and two friends who accompanied him to the visit of potential etiologies and what is unlikely.  I broke the discussion down to possibilities of rheumatologic/systemic, medication and infection. For infection, I feel this is very unlikely.  He has been concerned with discitis despite a negative MRI feeling that it is being masked by the antibiotic use based on his recent reading.  I discussed with him that antibiotics use does not mask a diagnosis, only culture results and does not explain his overall findings, despite his readings in the internet.  I will though repeat the MRI and include the sacrum and hips bilateral as well.   His recent diagnoses of pneumonia are not consistent with true clinical pneumonia, only a non-specific radiographic diagnosis.  I discussed with him the FDA warning about azithromycin and heart arrythmia.  Inflammatory - I will check a CK since he has had significant myalgias and some other rheumatologic tests with his history of RA, though interestingly he does not have much arthralgias despite being off of Humira.    Medications - he is on several medications that can cause this, namely Remeron, Lyrica, Baclofen; maybe Strattera.  It will be difficult to stop the Lyrica due to the pain but may need to be done if this continues.  I will have him stop the Remeron for now and see how he does in 4 weeks.   Endocrinology - he has been off of his synthroid which certainly can be attributable to this.    Plan: 1) MRI 2) stop Remeron 3) labs RTC 4 weeks  I discussed with him that it is very likely no diagnosis will be found if above does not reveal anything.

## 2016-08-24 ENCOUNTER — Telehealth: Payer: Self-pay | Admitting: *Deleted

## 2016-08-24 LAB — CK TOTAL AND CKMB (NOT AT ARMC)
CK, MB: 6.3 ng/mL — ABNORMAL HIGH (ref 0.0–5.0)
Relative Index: 4.4 — ABNORMAL HIGH (ref 0.0–4.0)
Total CK: 144 U/L (ref 44–196)

## 2016-08-24 LAB — T4, FREE: Free T4: 1.2 ng/dL (ref 0.8–1.8)

## 2016-08-24 LAB — TSH: TSH: 0.7 m[IU]/L (ref 0.40–4.50)

## 2016-08-24 LAB — JO-1 ANTIBODY-IGG: Jo-1 Antibody, IgG: <1

## 2016-08-24 LAB — SJOGRENS SYNDROME-A EXTRACTABLE NUCLEAR ANTIBODY: SSA (RO) (ENA) ANTIBODY, IGG: NEGATIVE

## 2016-08-24 NOTE — Telephone Encounter (Signed)
Called patient and informed him of appointments for MRIs scheduled at [facility] on 09/02/16 at 2:45 pm. Patient had concerns that these tests are not being done with contrast; as he has read that with the contrast it is more reliable for diagnosis. Advised patient that Dr. Luciana Axe ordered these tests the way he preferred to have it done. He said he just wants to be sure that this time he gets the "correct diagnosis". Offered patient to schedule another appt with Dr. Luciana Axe to discuss his concerns and he said no, he would send MD a my chart message. He is aware that he is self pay and he is ok to proceed with these scheduled tests. Wendall Mola CMA

## 2016-09-01 ENCOUNTER — Encounter: Payer: Self-pay | Admitting: Internal Medicine

## 2016-09-02 ENCOUNTER — Other Ambulatory Visit: Payer: Self-pay | Admitting: Internal Medicine

## 2016-09-02 ENCOUNTER — Ambulatory Visit (HOSPITAL_COMMUNITY)
Admission: RE | Admit: 2016-09-02 | Discharge: 2016-09-02 | Disposition: A | Discharge status: Home / Self Care | Payer: Self-pay | Source: Ambulatory Visit | Attending: Internal Medicine | Admitting: Internal Medicine

## 2016-09-02 ENCOUNTER — Other Ambulatory Visit (HOSPITAL_COMMUNITY): Payer: Self-pay

## 2016-09-02 DIAGNOSIS — M545 Low back pain, unspecified: Secondary | ICD-10-CM

## 2016-09-02 DIAGNOSIS — M4807 Spinal stenosis, lumbosacral region: Secondary | ICD-10-CM | POA: Insufficient documentation

## 2016-09-02 DIAGNOSIS — M47896 Other spondylosis, lumbar region: Secondary | ICD-10-CM | POA: Insufficient documentation

## 2016-09-02 DIAGNOSIS — M71552 Other bursitis, not elsewhere classified, left hip: Secondary | ICD-10-CM | POA: Insufficient documentation

## 2016-09-02 DIAGNOSIS — M160 Bilateral primary osteoarthritis of hip: Secondary | ICD-10-CM | POA: Insufficient documentation

## 2016-09-02 DIAGNOSIS — M25452 Effusion, left hip: Secondary | ICD-10-CM | POA: Insufficient documentation

## 2016-09-02 MED ORDER — GADOBENATE DIMEGLUMINE 529 MG/ML IV SOLN
18.0000 mL | Freq: Once | INTRAVENOUS | Status: AC | PRN
  Administered 2016-09-02: 18 mL via INTRAVENOUS

## 2016-09-06 ENCOUNTER — Telehealth: Payer: Self-pay | Admitting: *Deleted

## 2016-09-06 ENCOUNTER — Encounter: Payer: Self-pay | Admitting: *Deleted

## 2016-09-06 NOTE — Telephone Encounter (Signed)
Left a voice mail for the patient to return my call. His voice mail asked for a text due to bad reception where he lives. I will try to send a mychart message. Charles Stevens

## 2016-09-06 NOTE — Telephone Encounter (Signed)
-----   Message from Gardiner Barefoot, MD sent at 09/06/2016  8:31 AM EDT ----- Can you let him know that there were no significant infectious findings on the MRIs done Friday.  It did note some bursitis of his left hip (inflammation of the left hip joint).  If he is having pain in his left hip with walking, I can send him to an orthopedist for evaluation.  thanks

## 2016-09-06 NOTE — Telephone Encounter (Signed)
Called patient

## 2016-09-06 NOTE — Telephone Encounter (Signed)
Patient notified and he said it does hurt when he walks, however it is not so debilitating that he needs to see an orthopedic at this time. He does have an upcoming appt with Dr. Luciana Axe and he said he will discuss it further at that time.

## 2016-09-27 ENCOUNTER — Ambulatory Visit: Payer: Self-pay | Admitting: Internal Medicine

## 2016-09-27 IMAGING — CT CT T SPINE W/ CM
2 of 3 series · 9 of 33 positions shown, 11 images · non-contrast
Comparison: none

CLINICAL DATA: Bilateral upper extremity weakness, left greater
than right. Upper extremity pain is worse on the right involving
both the C7 and C8 distribution. Right lower extremity pain in an L5
distribution. Numbness into both feet.
TECHNIQUE: Contiguous axial images were obtained through the Cervical,
Thoracic, and Lumbar spine after the intrathecal infusion of
infusion. Coronal and sagittal reconstructions were obtained of the
axial image sets.

[Series 2: t spine soft · axial · 0.33mm/px · z∈[-587,-293]mm · 6 of 128 slices shown, 8 images]
[im 20/128  soft-tissue]
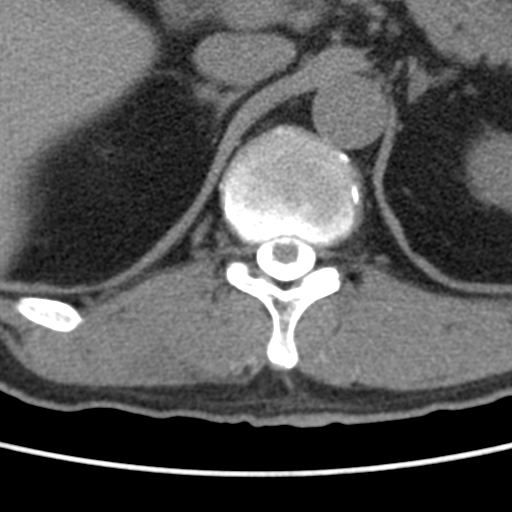
[im 20/128  bone]
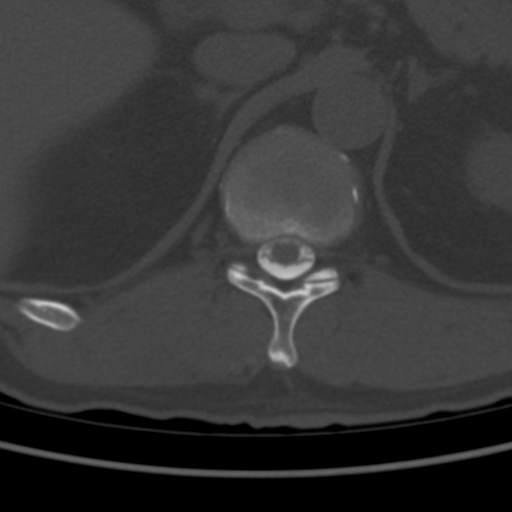
[im 40/128  bone]
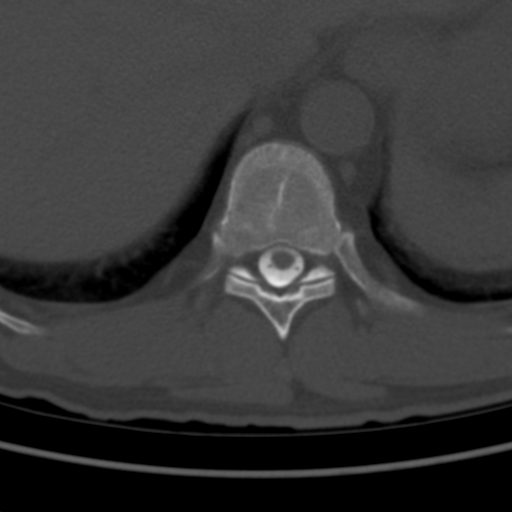
[im 59/128  bone]
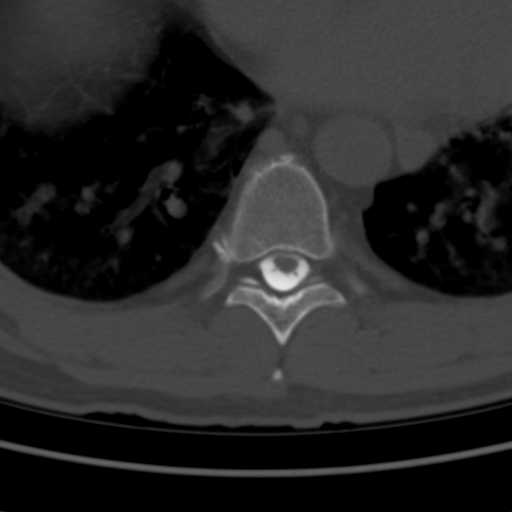
[im 79/128  bone]
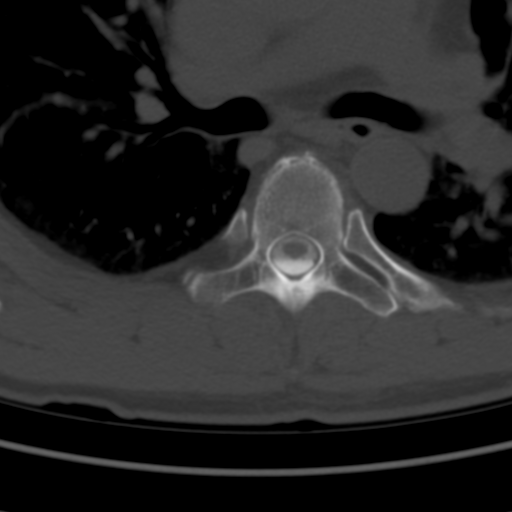
[im 98/128  soft-tissue]
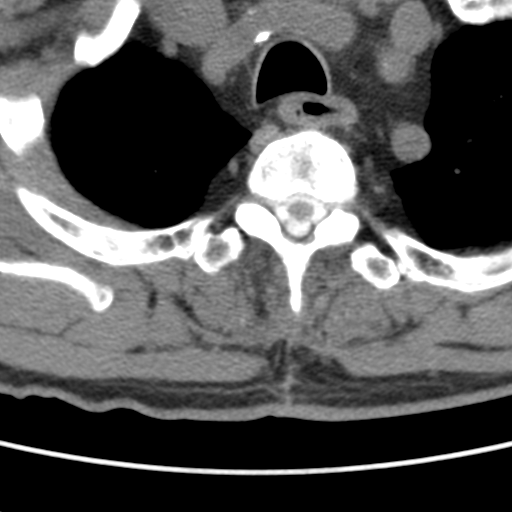
[im 98/128  bone]
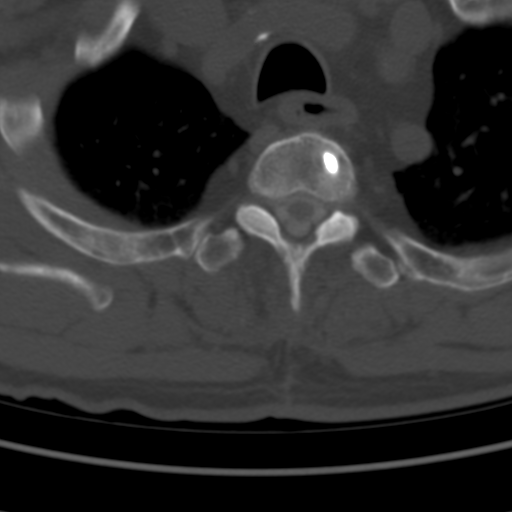
[im 118/128  bone]
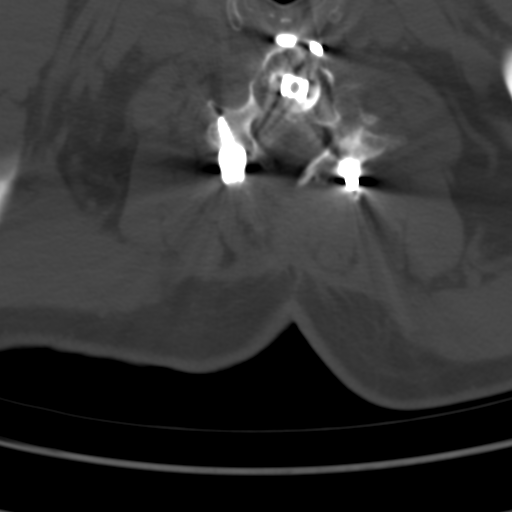

[Series 6: cor · coronal · 0.32mm/px · 3 of 48 slices shown]
[im 10/48  bone]
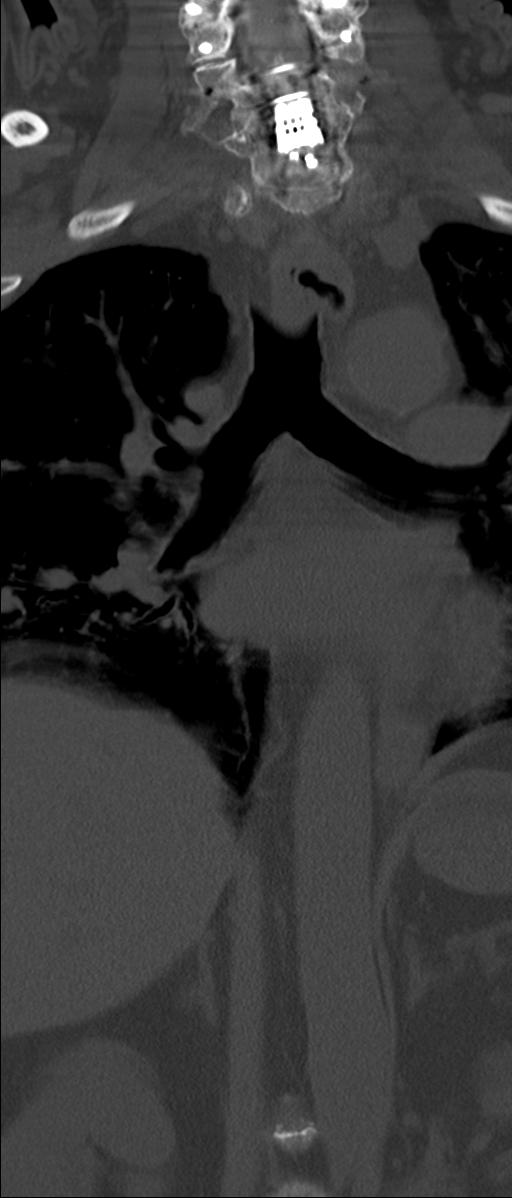
[im 19/48  bone]
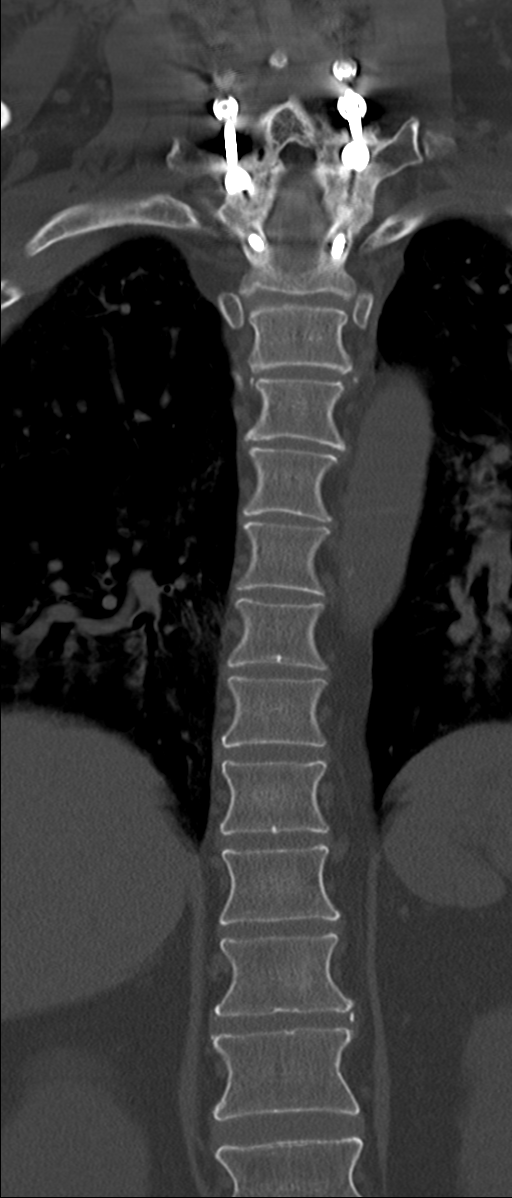
[im 29/48  bone]
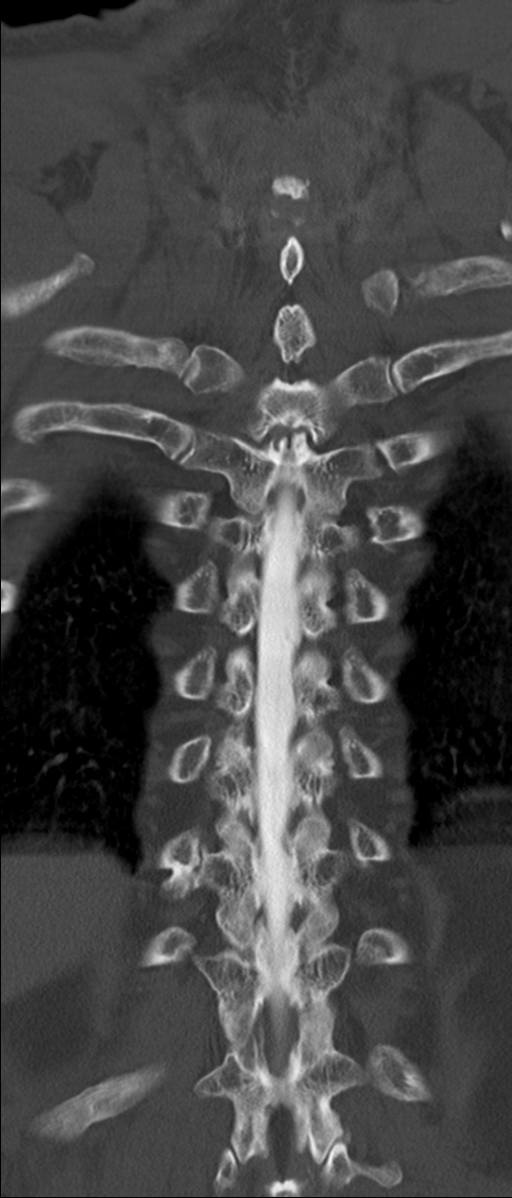

[9 of 33 positions shown; findings below may reference images not displayed]

FLUOROSCOPY TIME:  441.31 uGy*m2

PROCEDURE:
LUMBAR PUNCTURE FOR CERVICAL LUMBAR AND THORACIC MYELOGRAM

CERVICAL AND LUMBAR AND THORACIC MYELOGRAM

CT CERVICAL MYELOGRAM

CT LUMBAR MYELOGRAM

CT THORACIC MYELOGRAM

After thorough discussion of risks and benefits of the procedure
including bleeding, infection, injury to nerves, blood vessels,
adjacent structures as well as headache and CSF leak, written and
oral informed consent was obtained. Consent was obtained by Dr.
Hankyu Boateng.

Patient was positioned prone on the fluoroscopy table. Local
anesthesia was provided with 1% lidocaine without epinephrine after
prepped and draped in the usual sterile fashion. Puncture was
performed at L2-3 using a 3 1/2 inch 22-gauge spinal needle via left
paramedian approach. Using a single pass through the dura, the
needle was placed within the thecal sac, with return of clear CSF.
10 mL [id number]-M 300 was injected into the thecal sac, with normal
opacification of the nerve roots and cauda equina consistent with
free flow within the subarachnoid space. The patient was then moved
to the trendelenburg position and contrast flowed into the Thoracic
and Cervical spine regions.

I personally performed the lumbar puncture and administered the
intrathecal contrast. I also personally supervised acquisition of
the myelogram images.
FINDINGS: CERVICAL, THORACIC, AND LUMBAR MYELOGRAM FINDINGS:

Leftward curvature is present in the lumbar spine, centered at L3.
Subarticular narrowing is present on the left at L5-S1 potentially
affecting the left S1 nerve root. There is also early truncation of
the left L5 nerve root. Asymmetric moderate left facet degenerative
changes are present at L5-S1. Asymmetric moderate right facet
degenerative changes are present at L4-5

A broad-based disc protrusion is present at L5-S1. This is
exaggerated with standing.

An L4-5 disc protrusion is also noted with standing. Moderate
central canal stenosis is present at this level with standing.

Slight retrolisthesis at L3-4 is evident with standing. There is no
significant change in disc protrusions or alignment with flexion or
extension.

Twelve rib-bearing thoracic type vertebral bodies are present. A
superior endplate Schmorl's node is present at T12. Vertebral body
heights and alignment are otherwise normal. No focal stenosis is
evident.

Anterior cervical fusion is present at C4-5, C5-6, and C6-7. C5 and
C6 corpectomy is noted with the prosthesis in place. There is
posterior fusion C4-T2. Hardware is intact. Disc osteophyte complex
is present at C2-3 and C3-4. There is no abnormal motion with
flexion or extension.

CT CERVICAL MYELOGRAM FINDINGS:

Solid anterior fusion is present C4-C7. There is solid posterior
fusion C4-T2.

The soft tissues of the neck demonstrate mild atherosclerotic
changes. Lung apices are clear.

C2-3: A broad-based disc osteophyte complex is present. Advanced
facet hypertrophy and spurring is noted. This results in mild
central canal stenosis with severe left and moderate right foraminal
narrowing.

C3-4: A broad-based disc osteophyte complex effaces the ventral CSF.
Uncovertebral and facet spurring results in moderate foraminal
narrowing bilaterally, worse on the left.

C4-5. Solid fusion is present. No residual or recurrent stenosis is
present.

C5-6: Solid fusion is present no residual or recurrent stenosis is
present.

C6-7: Solid fusion is present. Mild osseous foraminal narrowing is
present on the right.

C7-T1: Posterior fusion is present. Facet hypertrophy contributes to
mild foraminal narrowing bilaterally. The central canal is patent.

CT LUMBAR MYELOGRAM FINDINGS:

Five non rib-bearing lumbar type vertebral bodies are present.
Slight retrolisthesis at L2-3 is stable. Vertebral body heights and
AP alignment are otherwise normal. Leftward curvature lumbar spine
is centered at L3.

The aneurysmal dilation is present in the proximal left iliac artery
measuring up to 2.5 cm and diameter, unchanged. Limited imaging of
the abdomen is otherwise unremarkable.

L1-2: Mild facet hypertrophy is present bilaterally. There is some
disc bulging. No significant stenosis is present.

L2-3: A broad-based disc protrusion is present. There is progressive
mild right subarticular narrowing. The foramina are patent
bilaterally.

L3-4: A broad-based disc protrusion is present. Mild subarticular
narrowing bilaterally is stable, worse on the right. Mild facet
hypertrophy contributes.

L4-5: A broad-based disc protrusion is present. Advanced facet
hypertrophy is worse on the right. Moderate central canal stenosis
is noted. Severe right and moderate left subarticular narrowing is
similar to the prior study. Mild foraminal narrowing is present
bilaterally, right greater than left.

L5-S1: Advanced facet hypertrophy is worse on the left. This has
progressed. Severe left and mild right subarticular and foraminal
stenosis is present.

CT THORACIC MYELOGRAM FINDINGS:

Twelve rib-bearing thoracic type vertebral bodies are present. A
superior endplate Schmorl's node is present at T12 on the left.
Vertebral body heights are otherwise normal. Rightward curvature is
centered at T9. No significant thoracic disc protrusion is present.
Mild osseous foraminal narrowing is worse on the left at T1-2 and
T2-3. Posterior fusion is noted at C7-T1 and T1-2.
IMPRESSION: 1. Asymmetric left-sided facet hypertrophy at L5-S1 with severe left
and mild right subarticular and foraminal narrowing. The disc
protrusion is more pronounced after standing.
2. Asymmetric advanced right-sided facet hypertrophy at L4-5
resulting in severe right and moderate left subarticular narrowing.
This likely affects the right L5 nerve root. The disc protrusion is
worse with standing.
3. Mild foraminal narrowing bilaterally at L4-5 is worse on the
right.
4. Progressive mild right subarticular narrowing at L2-3.
5. Mild subarticular narrowing bilaterally at L3-4 is worse on the
right.
6. Mild left greater than right foraminal narrowing at T1-2 and
T2-3.
7. Solid anterior fusion C4-7 and posterior fusion C4-T1. Hardware
is intact.
8. Severe left and moderate right foraminal stenosis at C2-3.
9. Moderate foraminal narrowing bilaterally at C3-4, left greater
than right.
10. Mild osseous narrowing on the right at C6-7 and bilaterally at
C7-T1.

## 2016-10-13 ENCOUNTER — Ambulatory Visit: Payer: Self-pay | Admitting: Internal Medicine

## 2017-05-16 DIAGNOSIS — M542 Cervicalgia: Secondary | ICD-10-CM | POA: Diagnosis not present

## 2017-05-16 DIAGNOSIS — G894 Chronic pain syndrome: Secondary | ICD-10-CM | POA: Diagnosis not present

## 2017-05-16 DIAGNOSIS — M545 Low back pain: Secondary | ICD-10-CM | POA: Diagnosis not present

## 2017-05-16 DIAGNOSIS — M4716 Other spondylosis with myelopathy, lumbar region: Secondary | ICD-10-CM | POA: Diagnosis not present

## 2017-06-06 DIAGNOSIS — L299 Pruritus, unspecified: Secondary | ICD-10-CM | POA: Diagnosis not present

## 2017-06-06 DIAGNOSIS — J0100 Acute maxillary sinusitis, unspecified: Secondary | ICD-10-CM | POA: Diagnosis not present

## 2017-06-12 DIAGNOSIS — Z91018 Allergy to other foods: Secondary | ICD-10-CM | POA: Diagnosis not present

## 2017-06-12 DIAGNOSIS — R5382 Chronic fatigue, unspecified: Secondary | ICD-10-CM | POA: Diagnosis not present

## 2017-06-12 DIAGNOSIS — E038 Other specified hypothyroidism: Secondary | ICD-10-CM | POA: Diagnosis not present

## 2017-06-12 DIAGNOSIS — E039 Hypothyroidism, unspecified: Secondary | ICD-10-CM | POA: Diagnosis not present

## 2017-06-15 DIAGNOSIS — Z79891 Long term (current) use of opiate analgesic: Secondary | ICD-10-CM | POA: Diagnosis not present

## 2017-06-15 DIAGNOSIS — M545 Low back pain: Secondary | ICD-10-CM | POA: Diagnosis not present

## 2017-06-15 DIAGNOSIS — G894 Chronic pain syndrome: Secondary | ICD-10-CM | POA: Diagnosis not present

## 2017-06-27 DIAGNOSIS — F4323 Adjustment disorder with mixed anxiety and depressed mood: Secondary | ICD-10-CM | POA: Diagnosis not present

## 2017-07-10 DIAGNOSIS — F4323 Adjustment disorder with mixed anxiety and depressed mood: Secondary | ICD-10-CM | POA: Diagnosis not present

## 2017-07-13 DIAGNOSIS — M545 Low back pain: Secondary | ICD-10-CM | POA: Diagnosis not present

## 2017-07-13 DIAGNOSIS — G894 Chronic pain syndrome: Secondary | ICD-10-CM | POA: Diagnosis not present

## 2017-07-13 DIAGNOSIS — M47816 Spondylosis without myelopathy or radiculopathy, lumbar region: Secondary | ICD-10-CM | POA: Diagnosis not present

## 2017-07-24 DIAGNOSIS — J3089 Other allergic rhinitis: Secondary | ICD-10-CM | POA: Diagnosis not present

## 2017-07-24 DIAGNOSIS — L298 Other pruritus: Secondary | ICD-10-CM | POA: Diagnosis not present

## 2017-07-24 DIAGNOSIS — J301 Allergic rhinitis due to pollen: Secondary | ICD-10-CM | POA: Diagnosis not present

## 2017-08-02 DIAGNOSIS — M0589 Other rheumatoid arthritis with rheumatoid factor of multiple sites: Secondary | ICD-10-CM | POA: Diagnosis not present

## 2017-08-02 DIAGNOSIS — M549 Dorsalgia, unspecified: Secondary | ICD-10-CM | POA: Diagnosis not present

## 2017-08-02 DIAGNOSIS — M5030 Other cervical disc degeneration, unspecified cervical region: Secondary | ICD-10-CM | POA: Diagnosis not present

## 2017-08-02 DIAGNOSIS — Z23 Encounter for immunization: Secondary | ICD-10-CM | POA: Diagnosis not present

## 2017-08-02 DIAGNOSIS — M25559 Pain in unspecified hip: Secondary | ICD-10-CM | POA: Diagnosis not present

## 2017-08-02 DIAGNOSIS — M160 Bilateral primary osteoarthritis of hip: Secondary | ICD-10-CM | POA: Diagnosis not present

## 2017-08-04 DIAGNOSIS — F4323 Adjustment disorder with mixed anxiety and depressed mood: Secondary | ICD-10-CM | POA: Diagnosis not present

## 2017-08-07 DIAGNOSIS — L700 Acne vulgaris: Secondary | ICD-10-CM | POA: Diagnosis not present

## 2017-08-07 DIAGNOSIS — B351 Tinea unguium: Secondary | ICD-10-CM | POA: Diagnosis not present

## 2017-08-15 DIAGNOSIS — G894 Chronic pain syndrome: Secondary | ICD-10-CM | POA: Diagnosis not present

## 2017-08-15 DIAGNOSIS — M7918 Myalgia, other site: Secondary | ICD-10-CM | POA: Diagnosis not present

## 2017-08-18 DIAGNOSIS — F4323 Adjustment disorder with mixed anxiety and depressed mood: Secondary | ICD-10-CM | POA: Diagnosis not present

## 2017-09-01 DIAGNOSIS — F4323 Adjustment disorder with mixed anxiety and depressed mood: Secondary | ICD-10-CM | POA: Diagnosis not present

## 2017-09-06 DIAGNOSIS — M47816 Spondylosis without myelopathy or radiculopathy, lumbar region: Secondary | ICD-10-CM | POA: Diagnosis not present

## 2017-09-06 DIAGNOSIS — Z79899 Other long term (current) drug therapy: Secondary | ICD-10-CM | POA: Diagnosis not present

## 2017-09-06 DIAGNOSIS — M545 Low back pain: Secondary | ICD-10-CM | POA: Diagnosis not present

## 2017-09-06 DIAGNOSIS — M7918 Myalgia, other site: Secondary | ICD-10-CM | POA: Diagnosis not present

## 2017-09-06 DIAGNOSIS — G894 Chronic pain syndrome: Secondary | ICD-10-CM | POA: Diagnosis not present

## 2017-09-15 DIAGNOSIS — F4323 Adjustment disorder with mixed anxiety and depressed mood: Secondary | ICD-10-CM | POA: Diagnosis not present

## 2017-09-29 DIAGNOSIS — F4323 Adjustment disorder with mixed anxiety and depressed mood: Secondary | ICD-10-CM | POA: Diagnosis not present

## 2017-10-02 DIAGNOSIS — M5030 Other cervical disc degeneration, unspecified cervical region: Secondary | ICD-10-CM | POA: Diagnosis not present

## 2017-10-02 DIAGNOSIS — M79673 Pain in unspecified foot: Secondary | ICD-10-CM | POA: Diagnosis not present

## 2017-10-02 DIAGNOSIS — M25559 Pain in unspecified hip: Secondary | ICD-10-CM | POA: Diagnosis not present

## 2017-10-02 DIAGNOSIS — Z79899 Other long term (current) drug therapy: Secondary | ICD-10-CM | POA: Diagnosis not present

## 2017-10-02 DIAGNOSIS — M0589 Other rheumatoid arthritis with rheumatoid factor of multiple sites: Secondary | ICD-10-CM | POA: Diagnosis not present

## 2017-10-02 DIAGNOSIS — M549 Dorsalgia, unspecified: Secondary | ICD-10-CM | POA: Diagnosis not present

## 2017-10-16 DIAGNOSIS — M47816 Spondylosis without myelopathy or radiculopathy, lumbar region: Secondary | ICD-10-CM | POA: Diagnosis not present

## 2017-10-16 DIAGNOSIS — G894 Chronic pain syndrome: Secondary | ICD-10-CM | POA: Diagnosis not present

## 2017-10-16 DIAGNOSIS — M542 Cervicalgia: Secondary | ICD-10-CM | POA: Diagnosis not present

## 2017-10-16 DIAGNOSIS — M545 Low back pain: Secondary | ICD-10-CM | POA: Diagnosis not present

## 2017-10-17 ENCOUNTER — Other Ambulatory Visit: Payer: Self-pay | Admitting: Orthopaedic Surgery

## 2017-10-17 DIAGNOSIS — M545 Low back pain: Secondary | ICD-10-CM

## 2017-10-22 ENCOUNTER — Ambulatory Visit
Admission: RE | Admit: 2017-10-22 | Discharge: 2017-10-22 | Disposition: A | Discharge status: Home / Self Care | Payer: BLUE CROSS/BLUE SHIELD | Source: Ambulatory Visit | Attending: Orthopaedic Surgery | Admitting: Orthopaedic Surgery

## 2017-10-22 DIAGNOSIS — M545 Low back pain: Secondary | ICD-10-CM

## 2017-10-22 DIAGNOSIS — M48061 Spinal stenosis, lumbar region without neurogenic claudication: Secondary | ICD-10-CM | POA: Diagnosis not present

## 2017-11-03 DIAGNOSIS — F4323 Adjustment disorder with mixed anxiety and depressed mood: Secondary | ICD-10-CM | POA: Diagnosis not present

## 2017-11-17 DIAGNOSIS — F4323 Adjustment disorder with mixed anxiety and depressed mood: Secondary | ICD-10-CM | POA: Diagnosis not present

## 2017-11-21 DIAGNOSIS — F4323 Adjustment disorder with mixed anxiety and depressed mood: Secondary | ICD-10-CM | POA: Diagnosis not present

## 2017-11-30 DIAGNOSIS — M419 Scoliosis, unspecified: Secondary | ICD-10-CM | POA: Diagnosis not present

## 2017-11-30 DIAGNOSIS — G894 Chronic pain syndrome: Secondary | ICD-10-CM | POA: Diagnosis not present

## 2017-11-30 DIAGNOSIS — M4716 Other spondylosis with myelopathy, lumbar region: Secondary | ICD-10-CM | POA: Diagnosis not present

## 2017-11-30 DIAGNOSIS — M545 Low back pain: Secondary | ICD-10-CM | POA: Diagnosis not present

## 2017-12-06 ENCOUNTER — Other Ambulatory Visit: Payer: Self-pay

## 2017-12-06 DIAGNOSIS — I723 Aneurysm of iliac artery: Secondary | ICD-10-CM

## 2017-12-13 DIAGNOSIS — M25531 Pain in right wrist: Secondary | ICD-10-CM | POA: Diagnosis not present

## 2017-12-22 DIAGNOSIS — F4323 Adjustment disorder with mixed anxiety and depressed mood: Secondary | ICD-10-CM | POA: Diagnosis not present

## 2017-12-27 ENCOUNTER — Other Ambulatory Visit: Payer: Self-pay

## 2017-12-27 ENCOUNTER — Encounter: Payer: Self-pay | Admitting: Vascular Surgery

## 2017-12-27 ENCOUNTER — Ambulatory Visit (INDEPENDENT_AMBULATORY_CARE_PROVIDER_SITE_OTHER): Payer: BLUE CROSS/BLUE SHIELD | Admitting: Vascular Surgery

## 2017-12-27 ENCOUNTER — Ambulatory Visit (HOSPITAL_COMMUNITY)
Admission: RE | Admit: 2017-12-27 | Discharge: 2017-12-27 | Disposition: A | Discharge status: Home / Self Care | Payer: BLUE CROSS/BLUE SHIELD | Source: Ambulatory Visit | Attending: Vascular Surgery | Admitting: Vascular Surgery

## 2017-12-27 VITALS — BP 112/78 | HR 62 | Temp 97.6°F | Resp 16 | Ht 71.5 in | Wt 172.0 lb

## 2017-12-27 DIAGNOSIS — I723 Aneurysm of iliac artery: Secondary | ICD-10-CM | POA: Diagnosis not present

## 2017-12-27 DIAGNOSIS — I729 Aneurysm of unspecified site: Secondary | ICD-10-CM

## 2017-12-27 NOTE — Progress Notes (Signed)
Patient name: Charles Stevens MRN: [id number] DOB: [birth year] Sex: male  REASON FOR VISIT:   Follow-up of left common iliac artery aneurysm.  HPI:   Charles Stevens is a pleasant 57 y.o. male who I recently saw back in 2016 when he had an incidental finding of a left common iliac artery aneurysm that measured 2.5 cm in maximum diameter.  I last saw the patient in follow-up on 11/26/2014 and the aneurysm was 2.5 cm at that time.  It looks like he was then lost to follow-up.  He comes in today for a follow-up study.  Patient does not describe any symptoms consistent with claudication or rest pain.  He does have a long history of back pain and spinal stenosis and occasionally gets some pain in his left hip.  This does not necessarily associated with walking however.  He is on Eliquis as he had a pulmonary embolus in 2017.  He has successfully quit smoking.  He is very active and swims for 2 hours several times during the week.  Past Medical History:  Diagnosis Date  . Anemia   . Aneurysm (HCC)   . Attention deficit disorder   . Barrett's esophagus   . Cervical disc disorder with radiculopathy of cervical region 10/09/2014   Chronic right C7 radiculopathy  . Cervical spondylosis   . Chronic prostatitis    Possible chronic prostatitis  . Dysuria    Hx. of Dysuria  . Foot drop, left   . Memory loss   . Memory loss of unknown cause    dr [name] 2014 . NP Darrol Angel   . Rheumatoid arthritis(714.0)     Family History  Problem Relation Age of Onset  . Cancer Mother        Colon, Breast, Liver  . Varicose Veins Mother   . Hyperlipidemia Mother   . Hyperlipidemia Father   . Hypertension Father   . Heart attack Father   . Diabetes Brother   . Heart disease Brother   . Peripheral vascular disease Brother   . Deep vein thrombosis Brother   . Heart attack Brother     SOCIAL HISTORY: Social History   Tobacco Use  . Smoking status: Former Smoker    Types: Pipe  . Smokeless  tobacco: Never Used  . Tobacco comment: Quit in April  Substance Use Topics  . Alcohol use: No    Alcohol/week: 0.0 standard drinks    Allergies  Allergen Reactions  . Alpha-Gal Diarrhea    Current Outpatient Medications  Medication Sig Dispense Refill  . apixaban (ELIQUIS) 5 MG TABS tablet Take 1 tablet (5 mg total) by mouth 2 (two) times daily. 60 tablet 0  . atomoxetine (STRATTERA) 100 MG capsule Take 100 mg by mouth daily.    . baclofen (LIORESAL) 10 MG tablet Take 10-20 mg by mouth 3 (three) times daily.     Marland Kitchen buPROPion (WELLBUTRIN SR) 100 MG 12 hr tablet Take 150 mg by mouth daily with breakfast.    . celecoxib (CELEBREX) 200 MG capsule Take 200 mg by mouth daily.    . Levothyroxine Sodium 50 MCG CAPS Take 50 mcg by mouth daily before breakfast.     . omeprazole (PRILOSEC) 20 MG capsule Take 20 mg by mouth daily.    . ondansetron (ZOFRAN) 4 MG tablet Take 4 mg by mouth 3 (three) times daily as needed for nausea or vomiting.     Marland Kitchen oxyCODONE (ROXICODONE) 15 MG immediate release tablet Take  15 mg by mouth every 6 (six) hours as needed for pain.     . pramipexole (MIRAPEX) 0.25 MG tablet Take 0.75 mg by mouth at bedtime.    . pregabalin (LYRICA) 150 MG capsule Take 150 mg by mouth 3 (three) times daily.     . propranolol (INDERAL) 40 MG tablet Take 40 mg by mouth at bedtime.     . VOLTAREN 1 % GEL Apply 2 g topically 2 (two) times daily as needed (for pain).     Marland Kitchen HUMIRA PEN 40 MG/0.8ML PNKT   11   No current facility-administered medications for this visit.     REVIEW OF SYSTEMS:  [X]  denotes positive finding, [ ]  denotes negative finding Cardiac  Comments:  Chest pain or chest pressure:    Shortness of breath upon exertion:    Short of breath when lying flat:    Irregular heart rhythm:        Vascular    Pain in calf, thigh, or hip brought on by ambulation: x Left hip.   Pain in feet at night that wakes you up from your sleep:     Blood clot in your veins:    Leg  swelling:         Pulmonary    Oxygen at home:    Productive cough:     Wheezing:         Neurologic    Sudden weakness in arms or legs:     Sudden numbness in arms or legs:     Sudden onset of difficulty speaking or slurred speech:    Temporary loss of vision in one eye:     Problems with dizziness:  x       Gastrointestinal    Blood in stool:     Vomited blood:         Genitourinary    Burning when urinating:     Blood in urine:        Psychiatric    Major depression:         Hematologic    Bleeding problems:    Problems with blood clotting too easily:        Skin    Rashes or ulcers:        Constitutional    Fever or chills:     PHYSICAL EXAM:   Vitals:   12/27/17 0953  BP: 112/78  Pulse: 62  Resp: 16  Temp: 97.6 F (36.4 C)  TempSrc: Oral  SpO2: 98%  Weight: 172 lb (78 kg)  Height: 5' 11.5" (1.816 m)    GENERAL: The patient is a well-nourished male, in no acute distress. The vital signs are documented above. CARDIAC: There is a regular rate and rhythm.  VASCULAR: I do not detect carotid bruits. On the right side he has a palpable femoral, popliteal, and posterior tibial pulse.  I cannot palpate a dorsalis pedis pulse. On the left side he has a palpable femoral, popliteal, dorsalis pedis, and posterior tibial pulse. PULMONARY: There is good air exchange bilaterally without wheezing or rales. ABDOMEN: Soft and non-tender with normal pitched bowel sounds.  MUSCULOSKELETAL: There are no major deformities or cyanosis. NEUROLOGIC: No focal weakness or paresthesias are detected. SKIN: There are no ulcers or rashes noted. PSYCHIATRIC: The patient has a normal affect.  DATA:    DUPLEX ABDOMINAL AORTA: I have independently interpreted his duplex of the abdominal aorta.  The maximum diameter of the infrarenal aorta is 2.6 cm.  Thus this would not qualify as an aneurysm.  The right common iliac artery measures 1.5 cm in maximum diameter.  The left common iliac  artery measures 3.0 cm in maximum diameter.  MEDICAL ISSUES:   3.0 CM LEFT COMMON ILIAC ARTERY ANEURYSM: This aneurysm measured 2.5 cm in maximum diameter and 2016.  Thus it has enlarged only very slightly in the last 3 years.  I have ordered a follow-up duplex scan in 6 months.  If the aneurysm continues to enlarge approaches 4 cm and we could consider elective repair.  He would need CT angiography to determine if he might be a candidate for an iliac branch device.  Fortunately he is quit smoking.  He is very active.  I will see him back in 6 months.  He knows to call sooner if he has problems.  ANEURYSM OF ASCENDING AORTA: The patient told me today that he was also seen previously with an aneurysm of the ascending aorta which no one has been following.  I did find a CT scan from March 2018 which I reviewed.  This shows aneurysmal dilatation of the ascending thoracic aorta to a maximum diameter of 4.1 cm.  He would be due for a follow-up study and therefore I have ordered a CT angiogram of the chest and we will see him back to discuss these results.  Waverly Ferrari [facility] [phone]

## 2017-12-28 DIAGNOSIS — G894 Chronic pain syndrome: Secondary | ICD-10-CM | POA: Diagnosis not present

## 2017-12-28 DIAGNOSIS — M48062 Spinal stenosis, lumbar region with neurogenic claudication: Secondary | ICD-10-CM | POA: Diagnosis not present

## 2017-12-28 DIAGNOSIS — M4716 Other spondylosis with myelopathy, lumbar region: Secondary | ICD-10-CM | POA: Diagnosis not present

## 2017-12-28 DIAGNOSIS — M545 Low back pain: Secondary | ICD-10-CM | POA: Diagnosis not present

## 2018-01-02 DIAGNOSIS — M0589 Other rheumatoid arthritis with rheumatoid factor of multiple sites: Secondary | ICD-10-CM | POA: Diagnosis not present

## 2018-01-02 DIAGNOSIS — M5030 Other cervical disc degeneration, unspecified cervical region: Secondary | ICD-10-CM | POA: Diagnosis not present

## 2018-01-05 DIAGNOSIS — F4323 Adjustment disorder with mixed anxiety and depressed mood: Secondary | ICD-10-CM | POA: Diagnosis not present

## 2018-01-08 ENCOUNTER — Ambulatory Visit
Admission: RE | Admit: 2018-01-08 | Discharge: 2018-01-08 | Disposition: A | Discharge status: Home / Self Care | Payer: BLUE CROSS/BLUE SHIELD | Source: Ambulatory Visit | Attending: Vascular Surgery | Admitting: Vascular Surgery

## 2018-01-08 DIAGNOSIS — I729 Aneurysm of unspecified site: Secondary | ICD-10-CM

## 2018-01-08 DIAGNOSIS — I712 Thoracic aortic aneurysm, without rupture: Secondary | ICD-10-CM | POA: Diagnosis not present

## 2018-01-08 MED ORDER — IOPAMIDOL (ISOVUE-370) INJECTION 76%
75.0000 mL | Freq: Once | INTRAVENOUS | Status: AC | PRN
  Administered 2018-01-08: 75 mL via INTRAVENOUS

## 2018-01-18 DIAGNOSIS — Z86711 Personal history of pulmonary embolism: Secondary | ICD-10-CM | POA: Diagnosis not present

## 2018-01-18 DIAGNOSIS — Z8719 Personal history of other diseases of the digestive system: Secondary | ICD-10-CM | POA: Diagnosis not present

## 2018-01-18 DIAGNOSIS — R112 Nausea with vomiting, unspecified: Secondary | ICD-10-CM | POA: Diagnosis not present

## 2018-01-18 DIAGNOSIS — R1310 Dysphagia, unspecified: Secondary | ICD-10-CM | POA: Diagnosis not present

## 2018-01-22 ENCOUNTER — Other Ambulatory Visit: Payer: Self-pay | Admitting: Gastroenterology

## 2018-01-22 DIAGNOSIS — R112 Nausea with vomiting, unspecified: Secondary | ICD-10-CM

## 2018-01-26 DIAGNOSIS — Z01818 Encounter for other preprocedural examination: Secondary | ICD-10-CM | POA: Diagnosis not present

## 2018-01-26 DIAGNOSIS — Z5181 Encounter for therapeutic drug level monitoring: Secondary | ICD-10-CM | POA: Diagnosis not present

## 2018-01-29 ENCOUNTER — Ambulatory Visit (INDEPENDENT_AMBULATORY_CARE_PROVIDER_SITE_OTHER): Payer: BLUE CROSS/BLUE SHIELD | Admitting: Neurology

## 2018-01-29 ENCOUNTER — Encounter: Payer: Self-pay | Admitting: Neurology

## 2018-01-29 DIAGNOSIS — M79641 Pain in right hand: Secondary | ICD-10-CM

## 2018-01-29 DIAGNOSIS — Z0289 Encounter for other administrative examinations: Secondary | ICD-10-CM

## 2018-01-29 NOTE — Progress Notes (Signed)
Please refer to EMG and nerve conduction procedure note.  

## 2018-01-29 NOTE — Procedures (Signed)
     HISTORY:  Charles Stevens is a 57 year old gentleman with a history of extensive cervical spine surgery in the past, he reports a recent problem with severe pain involving the right hand with some radiation up the arm.  The patient is being evaluated for this new issue.  Patient believes that the right hand has become weaker.  NERVE CONDUCTION STUDIES:  Nerve conduction studies were performed on the right upper extremity.  The distal motor latency for the right median nerve was normal, slightly prolonged for the right ulnar nerve.  The motor amplitudes for the median and ulnar nerves were normal and the nerve conduction velocities for the median and ulnar nerves are normal but there was a drop off across the elbow for the right ulnar nerve for the nerve conduction velocities.  The sensory latencies for the right radial, median, ulnar and dorsal ulnar cutaneous nerves were normal.  The F-wave latency for the right ulnar nerve was prolonged.  EMG STUDIES:  EMG study was performed on the right upper extremity:  The first dorsal interosseous muscle reveals 2 to 4 K units with full recruitment. No fibrillations or positive waves were noted. The abductor pollicis brevis muscle reveals 2 to 4 K units with full recruitment. No fibrillations or positive waves were noted. The extensor indicis proprius muscle reveals 1 to 4 K units with slightly decreased recruitment. No fibrillations or positive waves were noted. The pronator teres muscle reveals 2 to 5 K units with decreased recruitment. No fibrillations or positive waves were noted. The flexor digitorum profundus muscle (III-IV) reveals 2 to 5 K units with decreased recruitment.  No fibrillations or positive waves were seen. The biceps muscle reveals 1 to 2 K units with full recruitment. No fibrillations or positive waves were noted. The triceps muscle reveals 2 to 6 K units with decreased recruitment. No fibrillations or positive waves were  noted. The anterior deltoid muscle reveals 2 to 3 K units with full recruitment. No fibrillations or positive waves were noted. The cervical paraspinal muscles were tested at 2 levels. No abnormalities of insertional activity were seen at either level tested. There was good relaxation.   IMPRESSION:  Nerve conduction studies done on the right upper extremity show evidence of mild distal slowing and some slowing across the elbow for the right ulnar nerve.  No evidence of carpal tunnel syndrome was seen.  EMG evaluation of the right upper extremity shows chronic stable denervation suggestive of an overlying C7 radiculopathy.  This is unchanged as compared to the prior study done in 2016.  No acute denervation was seen.  Marlan Palau MD 01/29/2018 3:51 PM  [facility] [address] [place], [place] [address]  Phone [phone] Fax [phone]

## 2018-01-29 NOTE — Progress Notes (Signed)
[facility]    Nerve / Sites Muscle Latency Ref. Amplitude Ref. Rel Amp Segments Distance Velocity Ref. Area    ms ms mV mV %  cm m/s m/s mVms  R Median - APB     Wrist APB 3.8 ?4.4 9.0 ?4.0 100 Wrist - APB 7   39.9     Upper arm APB 8.1  8.9  99 Upper arm - Wrist 26 60 ?49 36.5  R Ulnar - ADM     Wrist ADM 3.7 ?3.3 9.0 ?6.0 100 Wrist - ADM 7   35.4     B.Elbow ADM 7.8  8.3  91.6 B.Elbow - Wrist 24 59 ?49 32.9     A.Elbow ADM 10.2  8.2  98.5 A.Elbow - B.Elbow 12 49 ?49 31.9         A.Elbow - Wrist             SNC    Nerve / Sites Rec. Site Peak Lat Ref.  Amp Ref. Segments Distance    ms ms V V  cm  R Radial - Anatomical snuff box (Forearm)     Forearm Wrist 2.9 ?2.9 14 ?15 Forearm - Wrist 10  R Median - Orthodromic (Dig II, Mid palm)     Dig II Wrist 3.5 ?3.4 9 ?10 Dig II - Wrist 13  R Ulnar - Orthodromic, (Dig V, Mid palm)     Dig V Wrist 3.0 ?3.1 5 ?5 Dig V - Wrist 11  R Dorsal ulnar cutaneous - Hand dorsum (Forearm)     Forearm Hand dorsum 2.3 ?2.5 4 ?8 Forearm - Hand dorsum 8                      F  Wave    Nerve F Lat Ref.   ms ms  R Ulnar - ADM 34.6 ?32.0       EMG full

## 2018-01-30 DIAGNOSIS — M545 Low back pain: Secondary | ICD-10-CM | POA: Diagnosis not present

## 2018-01-30 DIAGNOSIS — Z4689 Encounter for fitting and adjustment of other specified devices: Secondary | ICD-10-CM | POA: Diagnosis not present

## 2018-01-30 DIAGNOSIS — M961 Postlaminectomy syndrome, not elsewhere classified: Secondary | ICD-10-CM | POA: Diagnosis not present

## 2018-01-30 DIAGNOSIS — M4716 Other spondylosis with myelopathy, lumbar region: Secondary | ICD-10-CM | POA: Diagnosis not present

## 2018-01-31 DIAGNOSIS — Z01810 Encounter for preprocedural cardiovascular examination: Secondary | ICD-10-CM | POA: Diagnosis not present

## 2018-01-31 DIAGNOSIS — R9431 Abnormal electrocardiogram [ECG] [EKG]: Secondary | ICD-10-CM | POA: Diagnosis not present

## 2018-01-31 DIAGNOSIS — R001 Bradycardia, unspecified: Secondary | ICD-10-CM | POA: Diagnosis not present

## 2018-01-31 DIAGNOSIS — Z01812 Encounter for preprocedural laboratory examination: Secondary | ICD-10-CM | POA: Diagnosis not present

## 2018-02-02 DIAGNOSIS — R112 Nausea with vomiting, unspecified: Secondary | ICD-10-CM | POA: Diagnosis not present

## 2018-02-02 DIAGNOSIS — K219 Gastro-esophageal reflux disease without esophagitis: Secondary | ICD-10-CM | POA: Diagnosis not present

## 2018-02-02 DIAGNOSIS — R1310 Dysphagia, unspecified: Secondary | ICD-10-CM | POA: Diagnosis not present

## 2018-02-02 DIAGNOSIS — K2270 Barrett's esophagus without dysplasia: Secondary | ICD-10-CM | POA: Diagnosis not present

## 2018-02-05 DIAGNOSIS — M4327 Fusion of spine, lumbosacral region: Secondary | ICD-10-CM | POA: Diagnosis not present

## 2018-02-05 DIAGNOSIS — M4326 Fusion of spine, lumbar region: Secondary | ICD-10-CM | POA: Diagnosis not present

## 2018-02-05 DIAGNOSIS — M4712 Other spondylosis with myelopathy, cervical region: Secondary | ICD-10-CM | POA: Diagnosis not present

## 2018-02-05 DIAGNOSIS — M532X6 Spinal instabilities, lumbar region: Secondary | ICD-10-CM | POA: Diagnosis not present

## 2018-02-05 DIAGNOSIS — M4156 Other secondary scoliosis, lumbar region: Secondary | ICD-10-CM | POA: Diagnosis not present

## 2018-02-05 DIAGNOSIS — M4186 Other forms of scoliosis, lumbar region: Secondary | ICD-10-CM | POA: Diagnosis not present

## 2018-02-05 DIAGNOSIS — M48062 Spinal stenosis, lumbar region with neurogenic claudication: Secondary | ICD-10-CM | POA: Diagnosis not present

## 2018-02-05 DIAGNOSIS — M419 Scoliosis, unspecified: Secondary | ICD-10-CM | POA: Diagnosis not present

## 2018-02-05 DIAGNOSIS — M4716 Other spondylosis with myelopathy, lumbar region: Secondary | ICD-10-CM | POA: Diagnosis not present

## 2018-02-06 DIAGNOSIS — K2270 Barrett's esophagus without dysplasia: Secondary | ICD-10-CM | POA: Diagnosis not present

## 2018-02-06 DIAGNOSIS — K219 Gastro-esophageal reflux disease without esophagitis: Secondary | ICD-10-CM | POA: Diagnosis not present

## 2018-02-07 ENCOUNTER — Ambulatory Visit: Payer: BLUE CROSS/BLUE SHIELD | Admitting: Vascular Surgery

## 2018-02-08 DIAGNOSIS — F4323 Adjustment disorder with mixed anxiety and depressed mood: Secondary | ICD-10-CM | POA: Diagnosis not present

## 2018-02-23 DIAGNOSIS — F4323 Adjustment disorder with mixed anxiety and depressed mood: Secondary | ICD-10-CM | POA: Diagnosis not present

## 2018-02-26 DIAGNOSIS — G5621 Lesion of ulnar nerve, right upper limb: Secondary | ICD-10-CM | POA: Diagnosis not present

## 2018-02-26 DIAGNOSIS — M25531 Pain in right wrist: Secondary | ICD-10-CM | POA: Diagnosis not present

## 2018-02-26 DIAGNOSIS — M5412 Radiculopathy, cervical region: Secondary | ICD-10-CM | POA: Diagnosis not present

## 2018-03-02 ENCOUNTER — Other Ambulatory Visit: Payer: BLUE CROSS/BLUE SHIELD

## 2018-03-06 DIAGNOSIS — M4716 Other spondylosis with myelopathy, lumbar region: Secondary | ICD-10-CM | POA: Diagnosis not present

## 2018-03-06 DIAGNOSIS — Z981 Arthrodesis status: Secondary | ICD-10-CM | POA: Diagnosis not present

## 2018-03-06 DIAGNOSIS — M549 Dorsalgia, unspecified: Secondary | ICD-10-CM | POA: Diagnosis not present

## 2018-03-06 DIAGNOSIS — M961 Postlaminectomy syndrome, not elsewhere classified: Secondary | ICD-10-CM | POA: Diagnosis not present

## 2018-03-06 DIAGNOSIS — M545 Low back pain: Secondary | ICD-10-CM | POA: Diagnosis not present

## 2018-03-07 ENCOUNTER — Other Ambulatory Visit: Payer: BLUE CROSS/BLUE SHIELD

## 2018-03-07 DIAGNOSIS — F4323 Adjustment disorder with mixed anxiety and depressed mood: Secondary | ICD-10-CM | POA: Diagnosis not present

## 2018-03-21 ENCOUNTER — Ambulatory Visit (INDEPENDENT_AMBULATORY_CARE_PROVIDER_SITE_OTHER): Payer: BLUE CROSS/BLUE SHIELD | Admitting: Vascular Surgery

## 2018-03-21 ENCOUNTER — Other Ambulatory Visit: Payer: Self-pay

## 2018-03-21 ENCOUNTER — Encounter: Payer: Self-pay | Admitting: Vascular Surgery

## 2018-03-21 VITALS — BP 145/98 | HR 82 | Temp 97.4°F | Resp 16 | Ht 72.0 in | Wt 180.0 lb

## 2018-03-21 DIAGNOSIS — I712 Thoracic aortic aneurysm, without rupture: Secondary | ICD-10-CM | POA: Diagnosis not present

## 2018-03-21 DIAGNOSIS — I7121 Aneurysm of the ascending aorta, without rupture: Secondary | ICD-10-CM

## 2018-03-21 NOTE — Progress Notes (Signed)
Patient name: BAKARY BRAMER MRN: [id number] DOB: [birth year] Sex: male  REASON FOR VISIT:   Follow-up of CT angiogram of the chest  HPI:   Charles Stevens is a pleasant 57 y.o. male who had been following with a left common iliac artery aneurysm.  I last saw him on 12/27/2017.  At that time he was doing well.  He is on Eliquis because of a history of a pulmonary embolus in 2017.  He had successfully quit smoking.  His left common iliac artery aneurysm measured 3.0 cm.  This had enlarged only slightly from 2.5 cm over the last 3 years.  I ordered a follow-up ultrasound in 6 months.  However he also mention that he had been followed in the past with an aneurysm of the ascending aorta.  The last CT scan that I could find was in March 2018 at which time the ascending thoracic aorta measured 4.1 cm in maximum diameter.  As he was due for a CT scan of the chest we ordered that test and he comes in today to discuss those results.  His CT scan was actually done in September but his appointment with me was rescheduled several times.   Since I saw him last he denies any chest pain abdominal pain or back pain.  He has had some recent back surgery and has had some leg weakness and is doing physical therapy in the pool for this.  Current Outpatient Medications  Medication Sig Dispense Refill  . apixaban (ELIQUIS) 5 MG TABS tablet Take 1 tablet (5 mg total) by mouth 2 (two) times daily. 60 tablet 0  . atomoxetine (STRATTERA) 100 MG capsule Take 100 mg by mouth daily.    . baclofen (LIORESAL) 10 MG tablet Take 10-20 mg by mouth 3 (three) times daily.     Marland Kitchen buPROPion (WELLBUTRIN SR) 100 MG 12 hr tablet Take 150 mg by mouth daily with breakfast.    . celecoxib (CELEBREX) 200 MG capsule Take 200 mg by mouth daily.    Marland Kitchen HUMIRA PEN 40 MG/0.8ML PNKT   11  . Levothyroxine Sodium 50 MCG CAPS Take 50 mcg by mouth daily before breakfast.     . omeprazole (PRILOSEC) 20 MG capsule Take 20 mg by mouth daily.    .  ondansetron (ZOFRAN) 4 MG tablet Take 4 mg by mouth 3 (three) times daily as needed for nausea or vomiting.     Marland Kitchen oxyCODONE (ROXICODONE) 15 MG immediate release tablet Take 15 mg by mouth every 6 (six) hours as needed for pain.     . pramipexole (MIRAPEX) 0.25 MG tablet Take 0.75 mg by mouth at bedtime.    . pregabalin (LYRICA) 150 MG capsule Take 150 mg by mouth 3 (three) times daily.     . propranolol (INDERAL) 40 MG tablet Take 40 mg by mouth at bedtime.     . VOLTAREN 1 % GEL Apply 2 g topically 2 (two) times daily as needed (for pain).      No current facility-administered medications for this visit.     REVIEW OF SYSTEMS:  [X]  denotes positive finding, [ ]  denotes negative finding Vascular    Leg swelling    Cardiac    Chest pain or chest pressure:    Shortness of breath upon exertion:    Short of breath when lying flat:    Irregular heart rhythm:    Constitutional    Fever or chills:     PHYSICAL EXAM:  Vitals:   03/21/18 1318  BP: (!) 145/98  Pulse: 82  Resp: 16  Temp: (!) 97.4 F (36.3 C)  TempSrc: Oral  Weight: 180 lb (81.6 kg)  Height: 6' (1.829 m)    GENERAL: The patient is a well-nourished male, in no acute distress. The vital signs are documented above. CARDIOVASCULAR: There is a regular rate and rhythm. PULMONARY: There is good air exchange bilaterally without wheezing or rales. He has no significant lower extremity swelling.  DATA:   CT SCAN CHEST: I reviewed his CT scan of the chest that was done on 01/08/2018.  This shows that the fusiform aneurysmal dilatation of the ascending thoracic aorta is stable in size at 48 mm.  This has not changed compared to the study in July 2016.  He has some bilateral pulmonary nodules which are also unchanged compared to the study in 2016.  Given that these have been stable for greater than 2 years suggest a benign etiology.  MEDICAL ISSUES:   4 CM FUSIFORM DILATATION OF THE ASCENDING THORACIC AORTA: This has been  stable in size since 2016.  I would recommend a follow-up CT scan in 2 years  I will be following him with his iliac artery aneurysm and when I see him back at the next visit we can arrange for his follow-up CT scan at that time.  He is due for a follow-up of his iliac aneurysm in the spring of next year.  Waverly Ferrari [facility] [phone]

## 2018-03-22 DIAGNOSIS — F4323 Adjustment disorder with mixed anxiety and depressed mood: Secondary | ICD-10-CM | POA: Diagnosis not present

## 2018-04-06 ENCOUNTER — Ambulatory Visit
Admission: RE | Admit: 2018-04-06 | Discharge: 2018-04-06 | Disposition: A | Discharge status: Home / Self Care | Payer: BLUE CROSS/BLUE SHIELD | Source: Ambulatory Visit | Attending: Gastroenterology | Admitting: Gastroenterology

## 2018-04-06 DIAGNOSIS — R112 Nausea with vomiting, unspecified: Secondary | ICD-10-CM | POA: Diagnosis not present

## 2018-05-03 DIAGNOSIS — M7918 Myalgia, other site: Secondary | ICD-10-CM | POA: Diagnosis not present

## 2018-05-03 DIAGNOSIS — M549 Dorsalgia, unspecified: Secondary | ICD-10-CM | POA: Diagnosis not present

## 2018-05-04 DIAGNOSIS — F4323 Adjustment disorder with mixed anxiety and depressed mood: Secondary | ICD-10-CM | POA: Diagnosis not present

## 2018-05-07 DIAGNOSIS — L298 Other pruritus: Secondary | ICD-10-CM | POA: Diagnosis not present

## 2018-05-07 DIAGNOSIS — J301 Allergic rhinitis due to pollen: Secondary | ICD-10-CM | POA: Diagnosis not present

## 2018-05-07 DIAGNOSIS — J3089 Other allergic rhinitis: Secondary | ICD-10-CM | POA: Diagnosis not present

## 2018-05-15 DIAGNOSIS — L700 Acne vulgaris: Secondary | ICD-10-CM | POA: Diagnosis not present

## 2018-06-07 DIAGNOSIS — M4326 Fusion of spine, lumbar region: Secondary | ICD-10-CM | POA: Diagnosis not present

## 2018-06-11 DIAGNOSIS — Z0271 Encounter for disability determination: Secondary | ICD-10-CM

## 2018-07-03 DIAGNOSIS — F4323 Adjustment disorder with mixed anxiety and depressed mood: Secondary | ICD-10-CM | POA: Diagnosis not present

## 2018-07-04 ENCOUNTER — Ambulatory Visit: Payer: BLUE CROSS/BLUE SHIELD | Admitting: Vascular Surgery

## 2018-07-04 ENCOUNTER — Inpatient Hospital Stay (HOSPITAL_COMMUNITY): Admission: RE | Admit: 2018-07-04 | Payer: BLUE CROSS/BLUE SHIELD | Source: Ambulatory Visit

## 2018-07-05 DIAGNOSIS — M545 Low back pain: Secondary | ICD-10-CM | POA: Diagnosis not present

## 2018-07-05 DIAGNOSIS — M546 Pain in thoracic spine: Secondary | ICD-10-CM | POA: Diagnosis not present

## 2018-07-05 DIAGNOSIS — Z6824 Body mass index (BMI) 24.0-24.9, adult: Secondary | ICD-10-CM | POA: Diagnosis not present

## 2018-07-05 DIAGNOSIS — M4326 Fusion of spine, lumbar region: Secondary | ICD-10-CM | POA: Diagnosis not present

## 2018-07-19 DIAGNOSIS — F4323 Adjustment disorder with mixed anxiety and depressed mood: Secondary | ICD-10-CM | POA: Diagnosis not present

## 2018-08-02 DIAGNOSIS — F4323 Adjustment disorder with mixed anxiety and depressed mood: Secondary | ICD-10-CM | POA: Diagnosis not present

## 2018-08-16 DIAGNOSIS — M545 Low back pain: Secondary | ICD-10-CM | POA: Diagnosis not present

## 2018-08-16 DIAGNOSIS — F4323 Adjustment disorder with mixed anxiety and depressed mood: Secondary | ICD-10-CM | POA: Diagnosis not present

## 2018-08-16 DIAGNOSIS — M546 Pain in thoracic spine: Secondary | ICD-10-CM | POA: Diagnosis not present

## 2018-08-16 DIAGNOSIS — M4326 Fusion of spine, lumbar region: Secondary | ICD-10-CM | POA: Diagnosis not present

## 2018-09-13 DIAGNOSIS — M542 Cervicalgia: Secondary | ICD-10-CM | POA: Diagnosis not present

## 2018-09-13 DIAGNOSIS — M4326 Fusion of spine, lumbar region: Secondary | ICD-10-CM | POA: Diagnosis not present

## 2018-09-13 DIAGNOSIS — M545 Low back pain: Secondary | ICD-10-CM | POA: Diagnosis not present

## 2018-09-13 DIAGNOSIS — Z79899 Other long term (current) drug therapy: Secondary | ICD-10-CM | POA: Diagnosis not present

## 2018-10-04 DIAGNOSIS — F4323 Adjustment disorder with mixed anxiety and depressed mood: Secondary | ICD-10-CM | POA: Diagnosis not present

## 2018-10-15 DIAGNOSIS — R399 Unspecified symptoms and signs involving the genitourinary system: Secondary | ICD-10-CM | POA: Diagnosis not present

## 2018-10-15 DIAGNOSIS — Z5181 Encounter for therapeutic drug level monitoring: Secondary | ICD-10-CM | POA: Diagnosis not present

## 2018-10-15 DIAGNOSIS — E038 Other specified hypothyroidism: Secondary | ICD-10-CM | POA: Diagnosis not present

## 2018-10-15 DIAGNOSIS — E291 Testicular hypofunction: Secondary | ICD-10-CM | POA: Diagnosis not present

## 2018-10-15 DIAGNOSIS — R7303 Prediabetes: Secondary | ICD-10-CM | POA: Diagnosis not present

## 2018-10-15 DIAGNOSIS — Z125 Encounter for screening for malignant neoplasm of prostate: Secondary | ICD-10-CM | POA: Diagnosis not present

## 2018-10-17 DIAGNOSIS — M0589 Other rheumatoid arthritis with rheumatoid factor of multiple sites: Secondary | ICD-10-CM | POA: Diagnosis not present

## 2018-10-17 DIAGNOSIS — M5030 Other cervical disc degeneration, unspecified cervical region: Secondary | ICD-10-CM | POA: Diagnosis not present

## 2018-10-17 DIAGNOSIS — M25559 Pain in unspecified hip: Secondary | ICD-10-CM | POA: Diagnosis not present

## 2018-10-17 DIAGNOSIS — Z79899 Other long term (current) drug therapy: Secondary | ICD-10-CM | POA: Diagnosis not present

## 2018-10-31 DIAGNOSIS — H2000 Unspecified acute and subacute iridocyclitis: Secondary | ICD-10-CM | POA: Diagnosis not present

## 2018-11-01 DIAGNOSIS — M4326 Fusion of spine, lumbar region: Secondary | ICD-10-CM | POA: Diagnosis not present

## 2018-11-01 DIAGNOSIS — M545 Low back pain: Secondary | ICD-10-CM | POA: Diagnosis not present

## 2018-11-01 DIAGNOSIS — M542 Cervicalgia: Secondary | ICD-10-CM | POA: Diagnosis not present

## 2018-11-06 ENCOUNTER — Other Ambulatory Visit: Payer: Self-pay | Admitting: Medical

## 2018-11-06 DIAGNOSIS — R42 Dizziness and giddiness: Secondary | ICD-10-CM

## 2018-11-06 DIAGNOSIS — R413 Other amnesia: Secondary | ICD-10-CM

## 2018-11-06 DIAGNOSIS — H539 Unspecified visual disturbance: Secondary | ICD-10-CM

## 2018-11-08 DIAGNOSIS — R5383 Other fatigue: Secondary | ICD-10-CM | POA: Diagnosis not present

## 2018-11-08 DIAGNOSIS — F419 Anxiety disorder, unspecified: Secondary | ICD-10-CM | POA: Diagnosis not present

## 2018-11-08 DIAGNOSIS — Z03818 Encounter for observation for suspected exposure to other biological agents ruled out: Secondary | ICD-10-CM | POA: Diagnosis not present

## 2018-11-08 DIAGNOSIS — G3184 Mild cognitive impairment, so stated: Secondary | ICD-10-CM | POA: Diagnosis not present

## 2018-11-08 DIAGNOSIS — R202 Paresthesia of skin: Secondary | ICD-10-CM | POA: Diagnosis not present

## 2018-11-21 ENCOUNTER — Telehealth: Payer: Self-pay | Admitting: Neurology

## 2018-11-21 NOTE — Telephone Encounter (Signed)
The pt called a few weeks back and asked about getting scheduled for a sleep. I informed the pt that he would have to be referred in since he had not seen Dr. Brett Fairy since 2014. Pt verbalized understanding. I called the pt today to get him schedule for sleep consult with Dr. Brett Fairy. The pt stated that he just needed to have a sleep study not see Dr. Brett Fairy. I informed the pt unforturnatley, with our office he would have to see the physician first for a consult. The pt then stated that he could just call and schedule an appt for that he did not need a referral. I informed the pt that he would still need a referral because, he has not seen Dr. Brett Fairy in over 3 years. The pt stated our office only did the process like this because, it is all about money. The pt stated he seen Dr. Jannifer Franklin 6 months ago. I informed the pt he had however, he has not seen one of our sleep doctors in over 3 years and that is why he needed to be referred in. The pt said he can just come see Dr. Jannifer Franklin and have him ordered the sleep study. I informed the pt that he could come see Dr. Jannifer Franklin however, Dr. Jannifer Franklin would still have to refer him over to see the sleep specialist. Dr. Jannifer Franklin would not be able to order a sleep study because, he does not see pt's for sleep. The pt stated he will come in and see Dr.Dohmeier so he can get in her face and tell her how she dropped the ball on his care. He stated he did a sleep study with [place] but, there was no follow up care on Dr. Edwena Felty end. I told the pt that maybe he didn't need to see Dr. Brett Fairy if he was going to be aggressive because he could run the risk of  being dismissed. The pt said he is not being aggressive he just does not like being dictated on who he needs to see. I apologized to the pt and told him again that Dr. Jannifer Franklin is unable to order for him to have a sleep study he would need to see a sleep specialist first. The pt stated he was going to call Dr. Jannifer Franklin and ask him why his  neurology degree was not sufficient  enough to order a sleep study. The pt then hung up the phone.

## 2018-11-30 ENCOUNTER — Ambulatory Visit
Admission: RE | Admit: 2018-11-30 | Discharge: 2018-11-30 | Disposition: A | Discharge status: Home / Self Care | Payer: BLUE CROSS/BLUE SHIELD | Source: Ambulatory Visit | Attending: Medical | Admitting: Medical

## 2018-11-30 ENCOUNTER — Other Ambulatory Visit: Payer: Self-pay

## 2018-11-30 DIAGNOSIS — R413 Other amnesia: Secondary | ICD-10-CM | POA: Diagnosis not present

## 2018-11-30 DIAGNOSIS — R4701 Aphasia: Secondary | ICD-10-CM | POA: Diagnosis not present

## 2018-11-30 DIAGNOSIS — R531 Weakness: Secondary | ICD-10-CM | POA: Diagnosis not present

## 2018-11-30 DIAGNOSIS — R42 Dizziness and giddiness: Secondary | ICD-10-CM | POA: Diagnosis not present

## 2018-11-30 DIAGNOSIS — H539 Unspecified visual disturbance: Secondary | ICD-10-CM

## 2018-11-30 MED ORDER — GADOBENATE DIMEGLUMINE 529 MG/ML IV SOLN
15.0000 mL | Freq: Once | INTRAVENOUS | Status: AC | PRN
  Administered 2018-11-30: 15 mL via INTRAVENOUS

## 2019-01-01 DIAGNOSIS — M545 Low back pain: Secondary | ICD-10-CM | POA: Diagnosis not present

## 2019-01-01 DIAGNOSIS — M4326 Fusion of spine, lumbar region: Secondary | ICD-10-CM | POA: Diagnosis not present

## 2019-01-01 DIAGNOSIS — M542 Cervicalgia: Secondary | ICD-10-CM | POA: Diagnosis not present

## 2019-03-01 DIAGNOSIS — M4326 Fusion of spine, lumbar region: Secondary | ICD-10-CM | POA: Diagnosis not present

## 2019-03-01 DIAGNOSIS — M542 Cervicalgia: Secondary | ICD-10-CM | POA: Diagnosis not present

## 2019-03-01 DIAGNOSIS — M545 Low back pain: Secondary | ICD-10-CM | POA: Diagnosis not present

## 2019-04-30 DIAGNOSIS — Z6824 Body mass index (BMI) 24.0-24.9, adult: Secondary | ICD-10-CM | POA: Diagnosis not present

## 2019-04-30 DIAGNOSIS — M545 Low back pain: Secondary | ICD-10-CM | POA: Diagnosis not present

## 2019-04-30 DIAGNOSIS — M4326 Fusion of spine, lumbar region: Secondary | ICD-10-CM | POA: Diagnosis not present

## 2019-04-30 DIAGNOSIS — M542 Cervicalgia: Secondary | ICD-10-CM | POA: Diagnosis not present

## 2020-03-20 ENCOUNTER — Other Ambulatory Visit: Payer: Self-pay

## 2020-03-20 ENCOUNTER — Emergency Department (HOSPITAL_COMMUNITY)
Admission: EM | Admit: 2020-03-20 | Discharge: 2020-03-21 | Disposition: A | Discharge status: Home / Self Care | Payer: Self-pay | Attending: Emergency Medicine | Admitting: Emergency Medicine

## 2020-03-20 ENCOUNTER — Emergency Department (HOSPITAL_COMMUNITY): Payer: Self-pay

## 2020-03-20 ENCOUNTER — Encounter (HOSPITAL_COMMUNITY): Payer: Self-pay

## 2020-03-20 DIAGNOSIS — Z89021 Acquired absence of right finger(s): Secondary | ICD-10-CM | POA: Insufficient documentation

## 2020-03-20 DIAGNOSIS — E039 Hypothyroidism, unspecified: Secondary | ICD-10-CM | POA: Insufficient documentation

## 2020-03-20 DIAGNOSIS — S61210A Laceration without foreign body of right index finger without damage to nail, initial encounter: Secondary | ICD-10-CM | POA: Insufficient documentation

## 2020-03-20 DIAGNOSIS — Z7901 Long term (current) use of anticoagulants: Secondary | ICD-10-CM | POA: Insufficient documentation

## 2020-03-20 DIAGNOSIS — Z86718 Personal history of other venous thrombosis and embolism: Secondary | ICD-10-CM | POA: Insufficient documentation

## 2020-03-20 DIAGNOSIS — W278XXA Contact with other nonpowered hand tool, initial encounter: Secondary | ICD-10-CM | POA: Insufficient documentation

## 2020-03-20 DIAGNOSIS — Z87891 Personal history of nicotine dependence: Secondary | ICD-10-CM | POA: Insufficient documentation

## 2020-03-20 DIAGNOSIS — Z23 Encounter for immunization: Secondary | ICD-10-CM | POA: Insufficient documentation

## 2020-03-20 MED ORDER — TETANUS-DIPHTH-ACELL PERTUSSIS 5-2.5-18.5 LF-MCG/0.5 IM SUSY
0.5000 mL | PREFILLED_SYRINGE | Freq: Once | INTRAMUSCULAR | Status: AC
  Administered 2020-03-21: 0.5 mL via INTRAMUSCULAR
  Filled 2020-03-20: qty 0.5

## 2020-03-20 MED ORDER — LIDOCAINE HCL 2 % IJ SOLN
10.0000 mL | Freq: Once | INTRAMUSCULAR | Status: AC
  Administered 2020-03-20: 200 mg via INTRADERMAL
  Filled 2020-03-20: qty 20

## 2020-03-20 MED ORDER — OXYCODONE HCL 5 MG PO TABS
5.0000 mg | ORAL_TABLET | Freq: Once | ORAL | Status: AC
  Administered 2020-03-20: 5 mg via ORAL
  Filled 2020-03-20: qty 1

## 2020-03-20 NOTE — ED Notes (Signed)
Pt transported to Xray. 

## 2020-03-20 NOTE — ED Provider Notes (Incomplete)
[name]-EMERGENCY DEPT Provider Note   CSN: [id number] Arrival date & time: 03/20/20  2112     History Chief Complaint  Patient presents with  . Extremity Laceration    Charles Stevens is a 59 y.o. male.  HPI   59 year old male history of anemia, aneurysm, ADHD, Barrett's esophagus, chronic prostatitis, who presents the emergency department today for evaluation of a right index finger laceration.  He is right-handed at baseline but has a history of a spinal cord injury with chronic neurologic deficits to the right upper extremity including chronic numbness and decreased range of motion. He also has a h/o reattachment of the right index finger many years ago. He states that he does not have any new changes in sensation to the finger today.  He does note that a few days ago he smashed his finger in a car door so therefore has a bruise to the distal finger.  Past Medical History:  Diagnosis Date  . Anemia   . Aneurysm (HCC)   . Attention deficit disorder   . Barrett's esophagus   . Cervical disc disorder with radiculopathy of cervical region 10/09/2014   Chronic right C7 radiculopathy  . Cervical spondylosis   . Chronic prostatitis    Possible chronic prostatitis  . Dysuria    Hx. of Dysuria  . Foot drop, left   . Memory loss   . Memory loss of unknown cause    dr [name] 2014 . NP Darrol Angel   . Rheumatoid arthritis(714.0)     Patient Active Problem List   Diagnosis Date Noted  . Lumbar pain 08/23/2016  . Pulmonary embolism (HCC) 07/02/2016  . Barrett's esophagus 07/02/2016  . Hypothyroidism 07/02/2016  . AKI (acute kidney injury) (HCC) 07/02/2016  . Chest pain on breathing   . Shortness of breath   . Cervical disc disorder with radiculopathy of cervical region 10/09/2014  . Hypersomnia, persistent 08/31/2012  . Memory loss of unknown cause   . Encounter for therapeutic drug monitoring 04/18/2012  . Memory loss 04/18/2012  . Aphasia  04/18/2012    Past Surgical History:  Procedure Laterality Date  . CERVICAL FUSION  2012, 2013 X2  and 2014  . NOSE SURGERY     with uvulectomy       Family History  Problem Relation Age of Onset  . Cancer Mother        Colon, Breast, Liver  . Varicose Veins Mother   . Hyperlipidemia Mother   . Hyperlipidemia Father   . Hypertension Father   . Heart attack Father   . Diabetes Brother   . Heart disease Brother   . Peripheral vascular disease Brother   . Deep vein thrombosis Brother   . Heart attack Brother     Social History   Tobacco Use  . Smoking status: Former Smoker    Types: Pipe  . Smokeless tobacco: Never Used  . Tobacco comment: Quit in April  Substance Use Topics  . Alcohol use: No    Alcohol/week: 0.0 standard drinks  . Drug use: No    Home Medications Prior to Admission medications   Medication Sig Start Date End Date Taking? Authorizing Provider  apixaban (ELIQUIS) 5 MG TABS tablet Take 1 tablet (5 mg total) by mouth 2 (two) times daily. 07/10/16   Leroy Sea, MD  atomoxetine (STRATTERA) 100 MG capsule Take 100 mg by mouth daily.    [provider]  baclofen (LIORESAL) 10 MG tablet Take  10-20 mg by mouth 3 (three) times daily.     [provider]  buPROPion (WELLBUTRIN SR) 100 MG 12 hr tablet Take 150 mg by mouth daily with breakfast.    [provider]  celecoxib (CELEBREX) 200 MG capsule Take 200 mg by mouth daily.    [provider]  HUMIRA PEN 40 MG/0.8ML PNKT  12/25/17   [provider]  Levothyroxine Sodium 50 MCG CAPS Take 50 mcg by mouth daily before breakfast.     [provider]  omeprazole (PRILOSEC) 20 MG capsule Take 20 mg by mouth daily.    [provider]  ondansetron (ZOFRAN) 4 MG tablet Take 4 mg by mouth 3 (three) times daily as needed for nausea or vomiting.     [provider]  oxyCODONE (ROXICODONE) 15 MG immediate release tablet Take 5 mg by mouth every 6  (six) hours as needed for pain.     [provider]  pramipexole (MIRAPEX) 0.25 MG tablet Take 0.75 mg by mouth at bedtime.    [provider]  pregabalin (LYRICA) 150 MG capsule Take 150 mg by mouth 3 (three) times daily.     [provider]  propranolol (INDERAL) 40 MG tablet Take 40 mg by mouth at bedtime.     [provider]  VOLTAREN 1 % GEL Apply 2 g topically 2 (two) times daily as needed (for pain).  06/12/12   [provider]    Allergies    Alpha-gal, Galactose, Other, and Pork-derived products  Review of Systems   Review of Systems  Musculoskeletal:       Finger pain  Skin: Positive for wound.  Neurological: Positive for weakness and numbness.    Physical Exam Updated Vital Signs BP 102/75 (BP Location: Right Arm)   Pulse 75   Temp 98.1 F (36.7 C) (Oral)   Resp 16   Ht 6' (1.829 m)   Wt 81.6 kg   SpO2 97%   BMI 24.41 kg/m   Physical Exam Constitutional:      General: He is not in acute distress.    Appearance: He is well-developed and well-nourished.  Eyes:     Conjunctiva/sclera: Conjunctivae normal.  Cardiovascular:     Rate and Rhythm: Normal rate and regular rhythm.  Pulmonary:     Effort: Pulmonary effort is normal.     Breath sounds: Normal breath sounds.  Musculoskeletal:     Comments: Decreased sensation to the right index finger (baseline). Ecchymosis noted to the distal finger (from injury 3 days ago). 4-5cm laceration across the palmar aspect of the right index finger from the MCP to just promixal to the DIP. Brisk cap refill distally.   Skin:    General: Skin is warm and dry.  Neurological:     Mental Status: He is alert and oriented to person, place, and time.     ED Results / Procedures / Treatments   Labs (all labs ordered are listed, but only abnormal results are displayed) Labs Reviewed - No data to display  EKG None  Radiology DG Finger Index Right  Result Date: 03/20/2020 CLINICAL  DATA:  Laceration to tip of index finger EXAM: RIGHT INDEX FINGER 2+V COMPARISON:  None. FINDINGS: There is a fracture through the distal phalanx tip of the right index finger. No subluxation or dislocation. Minimal displacement. IMPRESSION: Right index finger tuft fracture. Electronically Signed   By: Charlett Nose M.D.   On: 03/20/2020 23:06    Procedures  Procedures (including critical care time)  Medications Ordered in ED Medications  Tdap (BOOSTRIX) injection 0.5 mL (has no administration in time range)  lidocaine (XYLOCAINE) 2 % (with pres) injection 200 mg (200 mg Intradermal Given 03/20/20 2257)  oxyCODONE (Oxy IR/ROXICODONE) immediate release tablet 5 mg (5 mg Oral Given 03/20/20 2257)    ED Course  I have reviewed the triage vital signs and the nursing notes.  Pertinent labs & imaging results that were available during my care of the patient were reviewed by me and considered in my medical decision making (see chart for details).    MDM Rules/Calculators/A&P                          *** Final Clinical Impression(s) / ED Diagnoses Final diagnoses:  None    Rx / DC Orders ED Discharge Orders    None

## 2020-03-20 NOTE — ED Provider Notes (Signed)
[name]-EMERGENCY DEPT Provider Note   CSN: [id number] Arrival date & time: 03/20/20  2112     History Chief Complaint  Patient presents with  . Extremity Laceration    Charles Stevens is a 59 y.o. male.  HPI   59 year old male history of anemia, aneurysm, ADHD, Barrett's esophagus, chronic prostatitis, who presents the emergency department today for evaluation of a right index finger laceration.  He is right-handed at baseline but has a history of a spinal cord injury with chronic neurologic deficits to the right upper extremity including chronic numbness and decreased range of motion. He also has a h/o reattachment of the right index finger many years ago. He states that he does not have any new changes in sensation to the finger today.  He does note that a few days ago he smashed his finger in a car door so therefore has a bruise to the distal finger.  Past Medical History:  Diagnosis Date  . Anemia   . Aneurysm (HCC)   . Attention deficit disorder   . Barrett's esophagus   . Cervical disc disorder with radiculopathy of cervical region 10/09/2014   Chronic right C7 radiculopathy  . Cervical spondylosis   . Chronic prostatitis    Possible chronic prostatitis  . Dysuria    Hx. of Dysuria  . Foot drop, left   . Memory loss   . Memory loss of unknown cause    dr [name] 2014 . NP Darrol Angel   . Rheumatoid arthritis(714.0)     Patient Active Problem List   Diagnosis Date Noted  . Lumbar pain 08/23/2016  . Pulmonary embolism (HCC) 07/02/2016  . Barrett's esophagus 07/02/2016  . Hypothyroidism 07/02/2016  . AKI (acute kidney injury) (HCC) 07/02/2016  . Chest pain on breathing   . Shortness of breath   . Cervical disc disorder with radiculopathy of cervical region 10/09/2014  . Hypersomnia, persistent 08/31/2012  . Memory loss of unknown cause   . Encounter for therapeutic drug monitoring 04/18/2012  . Memory loss 04/18/2012  . Aphasia  04/18/2012    Past Surgical History:  Procedure Laterality Date  . CERVICAL FUSION  2012, 2013 X2  and 2014  . NOSE SURGERY     with uvulectomy       Family History  Problem Relation Age of Onset  . Cancer Mother        Colon, Breast, Liver  . Varicose Veins Mother   . Hyperlipidemia Mother   . Hyperlipidemia Father   . Hypertension Father   . Heart attack Father   . Diabetes Brother   . Heart disease Brother   . Peripheral vascular disease Brother   . Deep vein thrombosis Brother   . Heart attack Brother     Social History   Tobacco Use  . Smoking status: Former Smoker    Types: Pipe  . Smokeless tobacco: Never Used  . Tobacco comment: Quit in April  Substance Use Topics  . Alcohol use: No    Alcohol/week: 0.0 standard drinks  . Drug use: No    Home Medications Prior to Admission medications   Medication Sig Start Date End Date Taking? Authorizing Provider  apixaban (ELIQUIS) 5 MG TABS tablet Take 1 tablet (5 mg total) by mouth 2 (two) times daily. 07/10/16   Leroy Sea, MD  atomoxetine (STRATTERA) 100 MG capsule Take 100 mg by mouth daily.    [provider]  baclofen (LIORESAL) 10 MG tablet Take  10-20 mg by mouth 3 (three) times daily.     [provider]  buPROPion (WELLBUTRIN SR) 100 MG 12 hr tablet Take 150 mg by mouth daily with breakfast.    [provider]  celecoxib (CELEBREX) 200 MG capsule Take 200 mg by mouth daily.    [provider]  cephALEXin (KEFLEX) 500 MG capsule Take 1 capsule (500 mg total) by mouth 4 (four) times daily for 7 days. 03/21/20 03/28/20  Tieara Flitton S, PA-C  HUMIRA PEN 40 MG/0.8ML PNKT  12/25/17   [provider]  Levothyroxine Sodium 50 MCG CAPS Take 50 mcg by mouth daily before breakfast.     [provider]  omeprazole (PRILOSEC) 20 MG capsule Take 20 mg by mouth daily.    [provider]  ondansetron (ZOFRAN) 4 MG tablet Take 4 mg by mouth 3 (three) times  daily as needed for nausea or vomiting.     [provider]  oxyCODONE (ROXICODONE) 15 MG immediate release tablet Take 5 mg by mouth every 6 (six) hours as needed for pain.     [provider]  pramipexole (MIRAPEX) 0.25 MG tablet Take 0.75 mg by mouth at bedtime.    [provider]  pregabalin (LYRICA) 150 MG capsule Take 150 mg by mouth 3 (three) times daily.     [provider]  Probiotic Product (PROBIOTIC DAILY) CAPS Take 1 tablet by mouth daily. 03/21/20   Halei Hanover S, PA-C  propranolol (INDERAL) 40 MG tablet Take 40 mg by mouth at bedtime.     [provider]  VOLTAREN 1 % GEL Apply 2 g topically 2 (two) times daily as needed (for pain).  06/12/12   [provider]    Allergies    Alpha-gal, Galactose, Other, and Pork-derived products  Review of Systems   Review of Systems  Musculoskeletal:       Finger pain  Skin: Positive for wound.  Neurological: Positive for weakness and numbness.    Physical Exam Updated Vital Signs BP 113/74   Pulse 70   Temp 98.1 F (36.7 C)   Resp 16   Ht 6' (1.829 m)   Wt 81.6 kg   SpO2 98%   BMI 24.41 kg/m   Physical Exam Constitutional:      General: He is not in acute distress.    Appearance: He is well-developed and well-nourished.  Eyes:     Conjunctiva/sclera: Conjunctivae normal.  Cardiovascular:     Rate and Rhythm: Normal rate and regular rhythm.  Pulmonary:     Effort: Pulmonary effort is normal.     Breath sounds: Normal breath sounds.  Musculoskeletal:     Comments: Decreased sensation to the right index finger (baseline). Ecchymosis noted to the distal finger (from injury 3 days ago). 4-5cm laceration across the palmar aspect of the right index finger from the MCP to just promixal to the DIP. Brisk cap refill distally.   Skin:    General: Skin is warm and dry.  Neurological:     Mental Status: He is alert and oriented to person, place, and time.     ED Results  / Procedures / Treatments   Labs (all labs ordered are listed, but only abnormal results are displayed) Labs Reviewed - No data to display  EKG None  Radiology DG Finger Index Right  Result Date: 03/20/2020 CLINICAL DATA:  Laceration to tip of index finger EXAM: RIGHT INDEX FINGER 2+V COMPARISON:  None. FINDINGS: There is  a fracture through the distal phalanx tip of the right index finger. No subluxation or dislocation. Minimal displacement. IMPRESSION: Right index finger tuft fracture. Electronically Signed   By: Charlett Nose M.D.   On: 03/20/2020 23:06    Procedures .Marland KitchenLaceration Repair  Date/Time: 03/22/2020 12:21 AM Performed by: Karrie Meres, PA-C Authorized by: Karrie Meres, PA-C   Consent:    Consent obtained:  Verbal   Consent given by:  Patient   Risks, benefits, and alternatives were discussed: not applicable     Risks discussed:  Infection and need for additional repair   Alternatives discussed:  No treatment Universal protocol:    Procedure explained and questions answered to patient or proxy's satisfaction: yes     Imaging studies available: yes     Patient identity confirmed:  Verbally with patient Anesthesia:    Anesthesia method:  Nerve block and local infiltration   Local anesthetic:  Lidocaine 2% w/o epi   Block needle gauge:  27 G   Block anesthetic:  Lidocaine 2% w/o epi   Block injection procedure:  Anatomic landmarks identified, introduced needle, incremental injection, negative aspiration for blood and anatomic landmarks palpated   Block outcome:  Anesthesia achieved Laceration details:    Location:  Finger   Finger location:  R index finger   Length (cm):  5 Pre-procedure details:    Preparation:  Patient was prepped and draped in usual sterile fashion and imaging obtained to evaluate for foreign bodies Exploration:    Limited defect created (wound extended): no     Hemostasis achieved with:  Direct pressure   Imaging obtained: x-ray      Imaging outcome: foreign body not noted     Wound exploration: wound explored through full range of motion and entire depth of wound visualized     Contaminated: no   Treatment:    Area cleansed with:  Saline and povidone-iodine   Amount of cleaning:  Extensive   Irrigation solution:  Sterile saline   Irrigation method:  Pressure wash   Visualized foreign bodies/material removed: no     Debridement:  None   Undermining:  None   Scar revision: no   Skin repair:    Repair method:  Sutures   Suture size:  5-0   Suture material:  Prolene   Suture technique:  Simple interrupted   Number of sutures:  11 Approximation:    Approximation:  Close Repair type:    Repair type:  Simple Post-procedure details:    Dressing:  Antibiotic ointment, non-adherent dressing and splint for protection   Procedure completion:  Tolerated   (including critical care time)  Medications Ordered in ED Medications  lidocaine (XYLOCAINE) 2 % (with pres) injection 200 mg (200 mg Intradermal Given 03/20/20 2257)  oxyCODONE (Oxy IR/ROXICODONE) immediate release tablet 5 mg (5 mg Oral Given 03/20/20 2257)  Tdap (BOOSTRIX) injection 0.5 mL (0.5 mLs Intramuscular Given 03/21/20 0029)    ED Course  I have reviewed the triage vital signs and the nursing notes.  Pertinent labs & imaging results that were available during my care of the patient were reviewed by me and considered in my medical decision making (see chart for details).    MDM Rules/Calculators/A&P                           59 year old male presenting for evaluation of laceration to the right index finger after being cut by at hatchet.  Has history of amputation s/p reattachment of this finger and also has history of spinal cord injury with residual sensory deficits of the entire right upper extremity therefore neurovascular exam and strength testing of the digit is significantly decreased and I am unable to determine if the patient has a flexor  tendon injury.  He does appear to have brisk cap refill distally but there is some ecchymosis as he had a crush injury to the finger several days prior.  X-ray demonstrates a right index finger tuft fracture secondary to his injury a few days ago.  This does not communicate with the laceration that occurred today.  No foreign body noted within laceration.  Tdap was updated.  Wound was repaired successfully and patient was placed in a splint.  I discussed the case with Dr. Izora Ribas with hand surgery given concern for possible flexor tendon injury and patient will follow up in the office later this week.  Have advised on strict return precautions.  He voices understanding of plan reasons to return.  Questions answered.  Patient stable for discharge.  Final Clinical Impression(s) / ED Diagnoses Final diagnoses:  Laceration of right index finger, foreign body presence unspecified, nail damage status unspecified, initial encounter    Rx / DC Orders ED Discharge Orders         Ordered    cephALEXin (KEFLEX) 500 MG capsule  4 times daily        03/21/20 0024    Probiotic Product (PROBIOTIC DAILY) CAPS  Daily        03/21/20 0024           Karrie Meres, PA-C 03/22/20 0023    Rozelle Logan, DO 03/23/20 [phone]

## 2020-03-20 NOTE — ED Notes (Addendum)
Pt given a mixture of betadine and NS to soak finger

## 2020-03-20 NOTE — ED Triage Notes (Signed)
Pt reports cutting right 2nd digit on right hand on hatchet. Laceration approx 6cm.

## 2020-03-21 MED ORDER — PROBIOTIC DAILY PO CAPS: 1.0000 | ORAL_CAPSULE | Freq: Every day | ORAL | 0 refills | Status: AC

## 2020-03-21 MED ORDER — CEPHALEXIN 500 MG PO CAPS: 500.0000 mg | ORAL_CAPSULE | Freq: Four times a day (QID) | ORAL | 0 refills | Status: AC

## 2020-03-21 MED ORDER — BACITRACIN ZINC 500 UNIT/GM EX OINT
TOPICAL_OINTMENT | Freq: Two times a day (BID) | CUTANEOUS | Status: DC
  Filled 2020-03-21: qty 0.9

## 2020-03-21 NOTE — ED Notes (Signed)
11 sutures placed by PA. Pt instructed to follow up and have them removed in 10-14 days.

## 2020-03-21 NOTE — Discharge Instructions (Signed)
You were given a prescription for antibiotics. Please take the antibiotic prescription fully.   Please call Dr. Debby Bud office to schedule an appointment for follow up. You will need to have the stitches removed in 10-14 days.   Please return to the emergency room immediately if you experience any new or worsening symptoms or any symptoms that indicate worsening infection such as fevers, increased redness/swelling/pain, warmth, or drainage from the affected area.

## 2020-03-24 ENCOUNTER — Inpatient Hospital Stay (HOSPITAL_COMMUNITY)
Admission: EM | Admit: 2020-03-24 | Discharge: 2020-03-24 | DRG: 200 | Discharge status: Against Medical Advice | Payer: Self-pay | Attending: Emergency Medicine | Admitting: Emergency Medicine

## 2020-03-24 ENCOUNTER — Emergency Department (HOSPITAL_COMMUNITY): Payer: Self-pay

## 2020-03-24 ENCOUNTER — Encounter (HOSPITAL_COMMUNITY): Payer: Self-pay

## 2020-03-24 ENCOUNTER — Other Ambulatory Visit: Payer: Self-pay

## 2020-03-24 DIAGNOSIS — M069 Rheumatoid arthritis, unspecified: Secondary | ICD-10-CM | POA: Diagnosis present

## 2020-03-24 DIAGNOSIS — Z79899 Other long term (current) drug therapy: Secondary | ICD-10-CM

## 2020-03-24 DIAGNOSIS — Z83438 Family history of other disorder of lipoprotein metabolism and other lipidemia: Secondary | ICD-10-CM

## 2020-03-24 DIAGNOSIS — Z833 Family history of diabetes mellitus: Secondary | ICD-10-CM

## 2020-03-24 DIAGNOSIS — J939 Pneumothorax, unspecified: Secondary | ICD-10-CM | POA: Diagnosis present

## 2020-03-24 DIAGNOSIS — Y9389 Activity, other specified: Secondary | ICD-10-CM

## 2020-03-24 DIAGNOSIS — Z7901 Long term (current) use of anticoagulants: Secondary | ICD-10-CM

## 2020-03-24 DIAGNOSIS — K2270 Barrett's esophagus without dysplasia: Secondary | ICD-10-CM | POA: Diagnosis present

## 2020-03-24 DIAGNOSIS — Z86711 Personal history of pulmonary embolism: Secondary | ICD-10-CM

## 2020-03-24 DIAGNOSIS — Z803 Family history of malignant neoplasm of breast: Secondary | ICD-10-CM

## 2020-03-24 DIAGNOSIS — Z981 Arthrodesis status: Secondary | ICD-10-CM

## 2020-03-24 DIAGNOSIS — F988 Other specified behavioral and emotional disorders with onset usually occurring in childhood and adolescence: Secondary | ICD-10-CM | POA: Diagnosis present

## 2020-03-24 DIAGNOSIS — Z5329 Procedure and treatment not carried out because of patient's decision for other reasons: Secondary | ICD-10-CM | POA: Diagnosis not present

## 2020-03-24 DIAGNOSIS — Z800 Family history of malignant neoplasm of digestive organs: Secondary | ICD-10-CM

## 2020-03-24 DIAGNOSIS — R413 Other amnesia: Secondary | ICD-10-CM | POA: Diagnosis present

## 2020-03-24 DIAGNOSIS — S2232XA Fracture of one rib, left side, initial encounter for closed fracture: Secondary | ICD-10-CM

## 2020-03-24 DIAGNOSIS — D649 Anemia, unspecified: Secondary | ICD-10-CM | POA: Diagnosis present

## 2020-03-24 DIAGNOSIS — W010XXA Fall on same level from slipping, tripping and stumbling without subsequent striking against object, initial encounter: Secondary | ICD-10-CM | POA: Diagnosis present

## 2020-03-24 DIAGNOSIS — Z91014 Allergy to mammalian meats: Secondary | ICD-10-CM

## 2020-03-24 DIAGNOSIS — Z8249 Family history of ischemic heart disease and other diseases of the circulatory system: Secondary | ICD-10-CM

## 2020-03-24 DIAGNOSIS — M4722 Other spondylosis with radiculopathy, cervical region: Secondary | ICD-10-CM | POA: Diagnosis present

## 2020-03-24 DIAGNOSIS — Z20822 Contact with and (suspected) exposure to covid-19: Secondary | ICD-10-CM | POA: Diagnosis present

## 2020-03-24 DIAGNOSIS — M5010 Cervical disc disorder with radiculopathy, unspecified cervical region: Secondary | ICD-10-CM | POA: Diagnosis present

## 2020-03-24 DIAGNOSIS — R519 Headache, unspecified: Secondary | ICD-10-CM | POA: Diagnosis present

## 2020-03-24 DIAGNOSIS — S270XXA Traumatic pneumothorax, initial encounter: Principal | ICD-10-CM

## 2020-03-24 DIAGNOSIS — S2249XA Multiple fractures of ribs, unspecified side, initial encounter for closed fracture: Secondary | ICD-10-CM

## 2020-03-24 DIAGNOSIS — Z888 Allergy status to other drugs, medicaments and biological substances status: Secondary | ICD-10-CM

## 2020-03-24 DIAGNOSIS — W19XXXA Unspecified fall, initial encounter: Secondary | ICD-10-CM

## 2020-03-24 DIAGNOSIS — Z87891 Personal history of nicotine dependence: Secondary | ICD-10-CM

## 2020-03-24 LAB — BASIC METABOLIC PANEL
Anion gap: 6 (ref 5–15)
BUN: 14 mg/dL (ref 6–20)
CO2: 23 mmol/L (ref 22–32)
Calcium: 9 mg/dL (ref 8.9–10.3)
Chloride: 109 mmol/L (ref 98–111)
Creatinine, Ser: 1.19 mg/dL (ref 0.61–1.24)
GFR, Estimated: >60 mL/min (ref >60)
Glucose, Bld: 99 mg/dL (ref 70–99)
Potassium: 4.3 mmol/L (ref 3.5–5.1)
Sodium: 138 mmol/L (ref 135–145)

## 2020-03-24 LAB — CBC WITH DIFFERENTIAL/PLATELET
Abs Immature Granulocytes: 0.04 10*3/uL (ref 0.00–0.07)
Basophils Absolute: 0 10*3/uL (ref 0.0–0.1)
Basophils Relative: 0 %
Eosinophils Absolute: 0.2 10*3/uL (ref 0.0–0.5)
Eosinophils Relative: 2 %
HCT: 51.4 % (ref 39.0–52.0)
Hemoglobin: 17.4 g/dL — ABNORMAL HIGH (ref 13.0–17.0)
Immature Granulocytes: 0 %
Lymphocytes Relative: 17 %
Lymphs Abs: 1.6 10*3/uL (ref 0.7–4.0)
MCH: 32.4 pg (ref 26.0–34.0)
MCHC: 33.9 g/dL (ref 30.0–36.0)
MCV: 95.7 fL (ref 80.0–100.0)
Monocytes Absolute: 1 10*3/uL (ref 0.1–1.0)
Monocytes Relative: 11 %
Neutro Abs: 6.4 10*3/uL (ref 1.7–7.7)
Neutrophils Relative %: 70 %
Platelets: 255 10*3/uL (ref 150–400)
RBC: 5.37 MIL/uL (ref 4.22–5.81)
RDW: 14.4 % (ref 11.5–15.5)
WBC: 9.2 10*3/uL (ref 4.0–10.5)
nRBC: 0 % (ref 0.0–0.2)

## 2020-03-24 LAB — RESP PANEL BY RT-PCR (FLU A&B, COVID) ARPGX2
Influenza A by PCR: NEGATIVE
Influenza B by PCR: NEGATIVE
SARS Coronavirus 2 by RT PCR: NEGATIVE

## 2020-03-24 MED ORDER — OXYCODONE HCL 5 MG PO CAPS
5.0000 mg | ORAL_CAPSULE | Freq: Four times a day (QID) | ORAL | 0 refills | Status: AC | PRN
Start: 2020-03-24 — End: ?

## 2020-03-24 MED ORDER — GABAPENTIN 300 MG PO CAPS: 300.0000 mg | ORAL_CAPSULE | Freq: Three times a day (TID) | ORAL | Status: DC

## 2020-03-24 MED ORDER — METOPROLOL TARTRATE 5 MG/5ML IV SOLN: 5.0000 mg | Freq: Four times a day (QID) | INTRAVENOUS | Status: DC | PRN

## 2020-03-24 MED ORDER — IBUPROFEN 800 MG PO TABS: 800.0000 mg | ORAL_TABLET | Freq: Three times a day (TID) | ORAL | Status: DC

## 2020-03-24 MED ORDER — OXYCODONE HCL 5 MG PO TABS
5.0000 mg | ORAL_TABLET | Freq: Once | ORAL | Status: AC
  Administered 2020-03-24: 5 mg via ORAL
  Filled 2020-03-24: qty 1

## 2020-03-24 MED ORDER — SODIUM CHLORIDE 0.9% FLUSH: 3.0000 mL | INTRAVENOUS | Status: DC | PRN

## 2020-03-24 MED ORDER — OXYCODONE-ACETAMINOPHEN 5-325 MG PO TABS
1.0000 | ORAL_TABLET | Freq: Once | ORAL | Status: DC
  Filled 2020-03-24: qty 1

## 2020-03-24 MED ORDER — ONDANSETRON 4 MG PO TBDP: 4.0000 mg | ORAL_TABLET | Freq: Four times a day (QID) | ORAL | Status: DC | PRN

## 2020-03-24 MED ORDER — HYDRALAZINE HCL 20 MG/ML IJ SOLN: 10.0000 mg | INTRAMUSCULAR | Status: DC | PRN

## 2020-03-24 MED ORDER — METHOCARBAMOL 500 MG PO TABS: 500.0000 mg | ORAL_TABLET | Freq: Four times a day (QID) | ORAL | Status: DC | PRN

## 2020-03-24 MED ORDER — ONDANSETRON HCL 4 MG/2ML IJ SOLN: 4.0000 mg | Freq: Four times a day (QID) | INTRAMUSCULAR | Status: DC | PRN

## 2020-03-24 MED ORDER — SODIUM CHLORIDE 0.9% FLUSH: 3.0000 mL | Freq: Two times a day (BID) | INTRAVENOUS | Status: DC

## 2020-03-24 MED ORDER — HYDROMORPHONE HCL 1 MG/ML IJ SOLN: 0.5000 mg | INTRAMUSCULAR | Status: DC | PRN

## 2020-03-24 MED ORDER — BISACODYL 10 MG RE SUPP: 10.0000 mg | Freq: Every day | RECTAL | Status: DC | PRN

## 2020-03-24 MED ORDER — OXYCODONE HCL 5 MG PO TABS
5.0000 mg | ORAL_TABLET | ORAL | Status: DC | PRN
Start: 2020-03-24 — End: 2020-03-25

## 2020-03-24 MED ORDER — DOCUSATE SODIUM 100 MG PO CAPS: 100.0000 mg | ORAL_CAPSULE | Freq: Two times a day (BID) | ORAL | Status: DC

## 2020-03-24 MED ORDER — ACETAMINOPHEN 325 MG PO TABS
650.0000 mg | ORAL_TABLET | Freq: Four times a day (QID) | ORAL | Status: DC
  Administered 2020-03-24: 650 mg via ORAL
  Filled 2020-03-24: qty 2

## 2020-03-24 MED ORDER — SODIUM CHLORIDE 0.9 % IV SOLN: 250.0000 mL | INTRAVENOUS | Status: DC | PRN

## 2020-03-24 NOTE — Discharge Instructions (Addendum)
As we discussed, your chest x-ray today showed evidence of a fourth rib fracture and a pneumothorax.  We have recommended admission but you have decided to leave AGAINST MEDICAL ADVICE.  You can take Tylenol or Ibuprofen as directed for pain. You can alternate Tylenol and Ibuprofen every 4 hours. If you take Tylenol at 1pm, then you can take Ibuprofen at 5pm. Then you can take Tylenol again at 9pm.   Take pain medications as directed for break through pain. Do not drive or operate machinery while taking this medication.   Use incentive spirometer as directed.  Return to emergency department at anytime if you have difficulty breathing, severe chest pain or any other worsening concerning symptoms.

## 2020-03-24 NOTE — ED Notes (Signed)
Pt states he wants to leave AMA. Provider spoke with pt benefits  of stay and risks of leaving. Pt aware and acknowledged risk and benefits

## 2020-03-24 NOTE — ED Provider Notes (Signed)
[facility]-EMERGENCY DEPT Provider Note   CSN: [id number] Arrival date & time: 03/24/20  1339     History Chief Complaint  Patient presents with  . Fall    Charles REGEHR is a 59 y.o. male past medical history of anemia, aneurysm, Barrett's esophagus, cervical spondylosis, rheumatoid arthritis who presents for evaluation of left-sided chest pain, shortness of breath that began after mechanical fall that occurred yesterday.  He reports that yesterday, he was playing with a friend's dog and states that he tripped over a bush, causing him to fall.  He states that he hit his left cheek as well as his left left shoulder.  He states he also landed on his left chest.  He states that last night, he started having a lot more pain in the left side of his chest.  He felt like something was crunching.  He also reported that he had some difficulty breathing.  This morning, he felt like the pain was getting worse.  He states that he felt like he was tasting blood but never had any hemoptysis, hematemesis.  He states he has continued to have pain in his right cheek as well.  He is not currently on Eliquis.  He denies any blood thinner use.  Denies any preceding chest pain or dizziness that caused his fall.  He denies any LOC.  Denies any vision changes, neck pain, nausea/vomiting, abdominal pain, numbness/weakness of his arms or legs.  The history is provided by the patient.       Past Medical History:  Diagnosis Date  . Anemia   . Aneurysm (HCC)   . Attention deficit disorder   . Barrett's esophagus   . Cervical disc disorder with radiculopathy of cervical region 10/09/2014   Chronic right C7 radiculopathy  . Cervical spondylosis   . Chronic prostatitis    Possible chronic prostatitis  . Dysuria    Hx. of Dysuria  . Foot drop, left   . Memory loss   . Memory loss of unknown cause    dr [name] 2014 . NP Darrol Angel   . Rheumatoid arthritis(714.0)     Patient Active  Problem List   Diagnosis Date Noted  . Pneumothorax 03/24/2020  . Lumbar pain 08/23/2016  . Pulmonary embolism (HCC) 07/02/2016  . Barrett's esophagus 07/02/2016  . Hypothyroidism 07/02/2016  . AKI (acute kidney injury) (HCC) 07/02/2016  . Chest pain on breathing   . Shortness of breath   . Cervical disc disorder with radiculopathy of cervical region 10/09/2014  . Hypersomnia, persistent 08/31/2012  . Memory loss of unknown cause   . Encounter for therapeutic drug monitoring 04/18/2012  . Memory loss 04/18/2012  . Aphasia 04/18/2012    Past Surgical History:  Procedure Laterality Date  . CERVICAL FUSION  2012, 2013 X2  and 2014  . NOSE SURGERY     with uvulectomy       Family History  Problem Relation Age of Onset  . Cancer Mother        Colon, Breast, Liver  . Varicose Veins Mother   . Hyperlipidemia Mother   . Hyperlipidemia Father   . Hypertension Father   . Heart attack Father   . Diabetes Brother   . Heart disease Brother   . Peripheral vascular disease Brother   . Deep vein thrombosis Brother   . Heart attack Brother     Social History   Tobacco Use  . Smoking status: Former Smoker  Types: Pipe  . Smokeless tobacco: Never Used  . Tobacco comment: Quit in April  Substance Use Topics  . Alcohol use: No    Alcohol/week: 0.0 standard drinks  . Drug use: No    Home Medications Prior to Admission medications   Medication Sig Start Date End Date Taking? Authorizing Provider  acetaminophen (TYLENOL) 500 MG tablet Take 1,000 mg by mouth every 6 (six) hours as needed for mild pain.   Yes [provider]  apixaban (ELIQUIS) 5 MG TABS tablet Take 1 tablet (5 mg total) by mouth 2 (two) times daily. 07/10/16  Yes Leroy Sea, MD  melatonin 5 MG TABS Take 5 mg by mouth at bedtime.   Yes [provider]  ondansetron (ZOFRAN) 4 MG tablet Take 4 mg by mouth 3 (three) times daily as needed for nausea or vomiting.    Yes [provider]   pramipexole (MIRAPEX) 0.25 MG tablet Take 0.75 mg by mouth at bedtime.   Yes [provider]  pregabalin (LYRICA) 150 MG capsule Take 150 mg by mouth 3 (three) times daily.    Yes [provider]  propranolol (INDERAL) 40 MG tablet Take 40 mg by mouth 2 (two) times daily.   Yes [provider]  VOLTAREN 1 % GEL Apply 2 g topically 2 (two) times daily as needed (for pain).  06/12/12  Yes [provider]  cephALEXin (KEFLEX) 500 MG capsule Take 1 capsule (500 mg total) by mouth 4 (four) times daily for 7 days. 03/21/20 03/28/20  Couture, Cortni S, PA-C  levothyroxine (SYNTHROID) 50 MCG tablet Take 50 mcg by mouth daily before breakfast.    [provider]  oxycodone (OXY-IR) 5 MG capsule Take 1 capsule (5 mg total) by mouth every 6 (six) hours as needed. 03/24/20   Maxwell Caul, PA-C  Probiotic Product (PROBIOTIC DAILY) CAPS Take 1 tablet by mouth daily. 03/21/20   Couture, Cortni S, PA-C    Allergies    Alpha-gal, Galactose, Other, and Pork-derived products  Review of Systems   Review of Systems  Constitutional: Negative for fever.  HENT:       Facial pain  Eyes: Negative for visual disturbance.  Respiratory: Positive for shortness of breath. Negative for cough.   Cardiovascular: Positive for chest pain.  Gastrointestinal: Negative for abdominal pain, nausea and vomiting.  Genitourinary: Negative for dysuria and hematuria.  Musculoskeletal:       Left shoulder pain  Neurological: Negative for weakness, numbness and headaches.  All other systems reviewed and are negative.   Physical Exam Updated Vital Signs BP (!) 143/90   Pulse 94   Temp 98.3 F (36.8 C)   Resp 18   SpO2 100%   Physical Exam Vitals and nursing note reviewed.  Constitutional:      Appearance: Normal appearance. He is well-developed and well-nourished.  HENT:     Head: Normocephalic.      Comments: Tenderness palpation noted with ecchymosis to the left  zygomatic arch.  No deformity or crepitus noted.  No tenderness palpation periorbital region.  No tenderness palpation of the mandible.    Mouth/Throat:     Mouth: Oropharynx is clear and moist and mucous membranes are normal.  Eyes:     General: Lids are normal.     Extraocular Movements: EOM normal.     Conjunctiva/sclera: Conjunctivae normal.     Pupils: Pupils are equal, round, and reactive to light.     Comments: PERRL. EOMs intact  without any difficulty. No nystagmus. No neglect.   Neck:     Comments: Full flexion/extension and lateral movement of neck fully intact. No bony midline tenderness. No deformities or crepitus.  Cardiovascular:     Rate and Rhythm: Normal rate and regular rhythm.     Pulses: Normal pulses.     Heart sounds: Normal heart sounds. No murmur heard. No friction rub. No gallop.   Pulmonary:     Effort: Pulmonary effort is normal.     Breath sounds: Decreased air movement present.     Comments: Slightly decreased breath sounds noted but patient with limited respiratory effort secondary to pain.  Chest:       Comments: Tenderness palpation noted to left anterior chest. Abdominal:     Palpations: Abdomen is soft. Abdomen is not rigid.     Tenderness: There is no abdominal tenderness. There is no guarding.     Comments: Abdomen is soft, non-distended, non-tender. No rigidity, No guarding. No peritoneal signs.  Musculoskeletal:        General: Normal range of motion.     Cervical back: Full passive range of motion without pain.     Comments: Tenderness palpation in the left shoulder.  Limited range of motion secondary to pain.  No deformity or crepitus noted.  No tenderness palpation in right upper extremity.  No pelvic instability. No tenderness to palpation to bilateral knees and ankles. No deformities or crepitus noted. FROM of BLE without any difficulty.   Skin:    General: Skin is warm and dry.     Capillary Refill: Capillary refill takes less than 2  seconds.  Neurological:     Mental Status: He is alert and oriented to person, place, and time.     Comments: Cranial nerves III-XII intact Follows commands, Moves all extremities  5/5 strength to BUE and BLE  Sensation intact throughout all major nerve distributions No slurred speech. No facial droop.   Psychiatric:        Mood and Affect: Mood and affect normal.        Speech: Speech normal.     ED Results / Procedures / Treatments   Labs (all labs ordered are listed, but only abnormal results are displayed) Labs Reviewed  CBC WITH DIFFERENTIAL/PLATELET - Abnormal; Notable for the following components:      Result Value   Hemoglobin 17.4 (*)    All other components within normal limits  RESP PANEL BY RT-PCR (FLU A&B, COVID) ARPGX2  BASIC METABOLIC PANEL  HIV ANTIBODY (ROUTINE TESTING W REFLEX)  CBC  BASIC METABOLIC PANEL    EKG Charles Stevens  Radiology DG Ribs Unilateral W/Chest Left  Result Date: 03/24/2020 CLINICAL DATA:  Left rib pain after fall. Difficulty breathing. EXAM: LEFT RIBS AND CHEST - 3+ VIEW COMPARISON:  07/02/2016 FINDINGS: Small left apical pneumothorax, less than 10%. There is a displaced fracture of the lateral left fourth rib. Streaky atelectasis at the left lung base. Normal heart size without mediastinal shift. Chronic linear opacities at the right lung base, favored to be scarring. Surgical hardware in the lower cervical spine is partially included IMPRESSION: 1. Small left apical pneumothorax, less than 10%. 2. Displaced fracture of the lateral left fourth rib. 3. Streaky atelectasis at the left lung base. Critical Value/emergent results were called by telephone at the time of interpretation on 03/24/2020 at 3:13 pm to Dr Rhunette Croft , who verbally acknowledged these results. Electronically Signed   By: Narda Rutherford M.D.   On:  03/24/2020 15:13   DG Shoulder Left  Result Date: 03/24/2020 CLINICAL DATA:  Post fall with left shoulder pain. EXAM: LEFT SHOULDER -  2+ VIEW COMPARISON:  Rib radiographs earlier this day. FINDINGS: No shoulder fracture or dislocation. Normal alignment and joint spaces. Left lung pneumothorax seen on rib radiographs earlier today. Left rib fracture not seen on the current exam. IMPRESSION: No shoulder fracture or dislocation. Electronically Signed   By: Narda Rutherford M.D.   On: 03/24/2020 18:17   CT Maxillofacial Wo Contrast  Result Date: 03/24/2020 CLINICAL DATA:  59 year old male with facial trauma. EXAM: CT MAXILLOFACIAL WITHOUT CONTRAST TECHNIQUE: Multidetector CT imaging of the maxillofacial structures was performed. Multiplanar CT image reconstructions were also generated. COMPARISON:  Head CT dated 11/19/2015. FINDINGS: Osseous: No acute fracture. No mandibular subluxation. Orbits: The globes and retro-orbital fat are preserved. Sinuses: Clear. Soft tissues: Negative. Limited intracranial: No significant or unexpected finding. IMPRESSION: No acute/traumatic facial bone fractures. Electronically Signed   By: Elgie Collard M.D.   On: 03/24/2020 18:37    Procedures Procedures (including critical care time)  Medications Ordered in ED Medications  oxyCODONE (Oxy IR/ROXICODONE) immediate release tablet 5 mg (has no administration in time range)  methocarbamol (ROBAXIN) tablet 500 mg (has no administration in time range)  ibuprofen (ADVIL) tablet 800 mg (has no administration in time range)  HYDROmorphone (DILAUDID) injection 0.5 mg (has no administration in time range)  gabapentin (NEURONTIN) capsule 300 mg (has no administration in time range)  acetaminophen (TYLENOL) tablet 650 mg (650 mg Oral Given 03/24/20 1921)  sodium chloride flush (NS) 0.9 % injection 3 mL (has no administration in time range)  sodium chloride flush (NS) 0.9 % injection 3 mL (has no administration in time range)  0.9 %  sodium chloride infusion (has no administration in time range)  metoprolol tartrate (LOPRESSOR) injection 5 mg (has no  administration in time range)  hydrALAZINE (APRESOLINE) injection 10 mg (has no administration in time range)  ondansetron (ZOFRAN-ODT) disintegrating tablet 4 mg (has no administration in time range)    Or  ondansetron (ZOFRAN) injection 4 mg (has no administration in time range)  docusate sodium (COLACE) capsule 100 mg (has no administration in time range)  bisacodyl (DULCOLAX) suppository 10 mg (has no administration in time range)  oxyCODONE (Oxy IR/ROXICODONE) immediate release tablet 5 mg (5 mg Oral Given 03/24/20 1635)    ED Course  I have reviewed the triage vital signs and the nursing notes.  Pertinent labs & imaging results that were available during my care of the patient were reviewed by me and considered in my medical decision making (see chart for details).    MDM Rules/Calculators/A&P                          59 year old male who presents for evaluation of left-sided chest pain after mechanical fall that occurred yesterday.  Also reports left-sided shoulder and facial pain.  He is on Eliquis.  Denies any LOC.  Initially arrival, he is afebrile, appears uncomfortable but no acute distress.  Vitals are stable.  He is satting well at 98% on room air.  On evaluation, he has tenderness palpation in the left chest as well as his left cheek and left shoulder.  He has some limited breath sounds secondary to limited effort from pain.  Chest x-ray ordered at triage.  We will add additional CT maxillofacial as well as left shoulder.  Chest x-ray shows fourth rib  fracture that is slightly displaced.  He has a 10% pneumothorax noted.  I discussed with Charlcie Cradle  (critical care APP) regarding recommendations for chest tube placement here in the ED.  Given that patient's pneumothorax is small and he is hemodynamically stable, he recommends not obtaining a chest tube at this time.  He recommends medicine admission with critical care consulting.  He recommends putting patient on oxygen.  Critical  care will consult as needed.  Covid negative.  CBC shows no leukocytosis or anemia.  BMP is unremarkable.  CT of maxillofacial shows no evidence of facial injury.  Left shoulder x-ray negative for any acute bony abnormality.  Discussed patient with Dr. Harlon Flor (Trauma) who recommends admission for observation.  He recommends that I discussed with Dr. Doylene Canard who is at [facility] as patient will likely need to be transferred.  Discussed with Dr. Fredricka Bonine (trauma).  She agrees for admission.  I had an extensive discussion with patient regarding admission for his pneumothorax.  We discussed at length regarding the need for observation to ensure that this does not progress any further.  After discussing, patient wished to decline admission.  I discussed with him that it was a recommendation of both critical care/pulmonary as well as trauma that he be observed overnight.  Patient is concerned because he has a history of alpha gal allergy and is concerned about possible exposure.  I discussed with patient that we could treat any possible reaction and that the benefit of observation for the pneumothorax would outweigh the risk of potential exposure.  After extensive discussion, patient wished to decline admission.  I discussed with him that if he did this, he would be leaving AGAINST MEDICAL ADVICE.  We discussed risk first benefits of leaving AGAINST MEDICAL ADVICE, including but not limited to worsening condition.  This conversation was witnessed by nurse Cristal Deer P.  Patient appears clinically sober and exhibits full medical decision-making capacity.  We will give him an incentive spirometer as well as short course of pain medication.  I did discuss with him precautions regarding pneumothorax and instructed him not to be on a plane for 30 days.  Patient instructed to return the emergency department at anytime if he feels like he is having difficulty breathing, chest pain. Patient had ample opportunity for questions and  discussion. All patient's questions were answered with full understanding.  Portions of this note were generated with Scientist, clinical (histocompatibility and immunogenetics). Dictation errors may occur despite best attempts at proofreading.   Final Clinical Impression(s) / ED Diagnoses Final diagnoses:  Traumatic pneumothorax, initial encounter  Closed fracture of one rib of left side, initial encounter    Rx / DC Orders ED Discharge Orders         Ordered    oxycodone (OXY-IR) 5 MG capsule  Every 6 hours PRN        03/24/20 1939           Rosana Hoes 03/24/20 2021    Bethann Berkshire, MD 03/24/20 2216

## 2020-03-24 NOTE — ED Notes (Addendum)
Patient states he does not want percocet. Pill wasted in sharps with Tobi Bastos, RN present.

## 2020-03-24 NOTE — ED Triage Notes (Addendum)
Pt presents with c/o right side rib pain after a fall that occurred yesterday. Pt also c/o right side cheek bone pain that also happened with the fall. Pt reports pain with inhalation and exhalation at the rib cage area.

## 2020-03-24 NOTE — Consult Note (Signed)
NAME:  Charles Stevens, MRN:  [id number], DOB:  31-Aug-1960, LOS: 0 ADMISSION DATE:  03/24/2020, CONSULTATION DATE:  12/14 REFERRING provider: Elson Clan, CHIEF COMPLAINT: I taste blood  Brief History   59 year old male presented with traumatic pneumothorax on 12/14. PCCM consulted for further evaluation  History of present illness   This is a 59 year old male who was helping a friend with yard work when he tripped and fell yesterday and landed on an outstretched hand and his elbow thrust into his left chest. He said that he was sore when he went to bed but he thought that he was okay. However, when he woke this morning he could taste blood and he was in quite a bit of pain so he came to the emergency room for further evaluation. He was found on chest imaging to have a broken rib and a less than 10% pneumothorax on the left. He currently denies dyspnea. He has some facial pain related to the fall.  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 rate (!) 22, SpO2 94 %.       No intake or output data in the 24 hours ending 03/24/20 1823 There were no vitals filed for this visit.  Examination:  General:  Resting comfortably in bed HENT: NCAT OP clear PULM: CTA B, normal effort CV: RRR, no mgr GI: BS+, soft, nontender MSK: normal bulk and tone Neuro: awake, alert, no distress, MAEW   Resolved Hospital Problem list     Assessment & Plan:  Traumatic pneumothorax without associated dyspnea: Likelihood of this worsening is less than 10%. Recommend admission, observation overnight, repeat chest imaging in the  morning Do not recommend chest tube at this time The patient tells me that he does not agree with admission because he is concerned that he will be exposed to mammalian meat and then have a severe allergic reaction. We had extensive conversation about this and he is quite convinced that his risk of having an allergic reaction by being exposed to mammalian meat after hospitalization is too high. If he leaves it would be AGAINST MEDICAL ADVICE.  PCCM will sign off  Best practice (evaluated daily)   Per ED  Labs   CBC: Recent Labs  Lab 03/24/20 1623  WBC 9.2  NEUTROABS 6.4  HGB 17.4*  HCT 51.4  MCV 95.7  PLT 255    Basic Metabolic Panel: Recent Labs  Lab 03/24/20 1623  NA 138  K 4.3  CL 109  CO2 23  GLUCOSE 99  BUN 14  CREATININE 1.19  CALCIUM 9.0   GFR: Estimated Creatinine Clearance: 75.2 mL/min (by C-G formula based on SCr of 1.19 mg/dL). Recent Labs  Lab 03/24/20 1623  WBC 9.2    Liver Function Tests: No results for input(s): AST, ALT, ALKPHOS, BILITOT, PROT, ALBUMIN in the last 168 hours. No results for input(s): LIPASE, AMYLASE in the last 168 hours. No results for input(s): AMMONIA in the last 168 hours.  ABG No results found for: PHART, PCO2ART, PO2ART, HCO3, TCO2, ACIDBASEDEF, O2SAT   Coagulation Profile: No results for input(s): INR, PROTIME in the  last 168 hours.  Cardiac Enzymes: No results for input(s): CKTOTAL, CKMB, CKMBINDEX, TROPONINI in the last 168 hours.  HbA1C: No results found for: HGBA1C  CBG: No results for input(s): GLUCAP in the last 168 hours.  Review of Systems:   Gen: Denies fever, chills, weight change, fatigue, night sweats HEENT: Denies blurred vision, double vision, hearing loss, tinnitus, sinus congestion, rhinorrhea, sore throat, neck stiffness, dysphagia PULM: per HPI CV: Denies chest pain, edema, orthopnea, paroxysmal nocturnal dyspnea, palpitations GI: Denies abdominal pain, nausea, vomiting, diarrhea,  hematochezia, melena, constipation, change in bowel habits GU: Denies dysuria, hematuria, polyuria, oliguria, urethral discharge Endocrine: Denies hot or cold intolerance, polyuria, polyphagia or appetite change Derm: Denies rash, dry skin, scaling or peeling skin change Heme: Denies easy bruising, bleeding, bleeding gums Neuro: Denies headache, numbness, weakness, slurred speech, loss of memory or consciousness   Past Medical History  He,  has a past medical history of Anemia, Aneurysm (HCC), Attention deficit disorder, Barrett's esophagus, Cervical disc disorder with radiculopathy of cervical region (10/09/2014), Cervical spondylosis, Chronic prostatitis, Dysuria, Foot drop, left, Memory loss, Memory loss of unknown cause, and Rheumatoid arthritis(714.0).   Surgical History    Past Surgical History:  Procedure Laterality Date  . CERVICAL FUSION  2012, 2013 X2  and 2014  . NOSE SURGERY     with uvulectomy     Social History   reports that he has quit smoking. His smoking use included pipe. He has never used smokeless tobacco. He reports that he does not drink alcohol and does not use drugs.   Family History   His family history includes Cancer in his mother; Deep vein thrombosis in his brother; Diabetes in his brother; Heart attack in his brother and father; Heart disease in his brother; Hyperlipidemia in his father and mother; Hypertension in his father; Peripheral vascular disease in his brother; Varicose Veins in his mother.   Allergies Allergies  Allergen Reactions  . Alpha-Gal Diarrhea  . Galactose Diarrhea  . Other Shortness Of Breath and Other (See Comments)    ALL BEEF / LAMB  . Pork-Derived Products Anaphylaxis     Home Medications  Prior to Admission medications   Medication Sig Start Date End Date Taking? Authorizing Provider  acetaminophen (TYLENOL) 500 MG tablet Take 1,000 mg by mouth every 6 (six) hours as needed for mild pain.   Yes [provider]   apixaban (ELIQUIS) 5 MG TABS tablet Take 1 tablet (5 mg total) by mouth 2 (two) times daily. 07/10/16  Yes Leroy Sea, MD  melatonin 5 MG TABS Take 5 mg by mouth at bedtime.   Yes [provider]  ondansetron (ZOFRAN) 4 MG tablet Take 4 mg by mouth 3 (three) times daily as needed for nausea or vomiting.    Yes [provider]  pramipexole (MIRAPEX) 0.25 MG tablet Take 0.75 mg by mouth at bedtime.   Yes [provider]  pregabalin (LYRICA) 150 MG capsule Take 150 mg by mouth 3 (three) times daily.    Yes [provider]  propranolol (INDERAL) 40 MG tablet Take 40 mg by mouth 2 (two) times daily.   Yes [provider]  VOLTAREN 1 % GEL Apply 2 g topically 2 (two) times daily as needed (for pain).  06/12/12  Yes [provider]  cephALEXin (KEFLEX) 500 MG capsule Take 1 capsule (500 mg total) by mouth 4 (four) times daily for 7 days. 03/21/20 03/28/20  Couture, Cortni S, PA-C  levothyroxine (SYNTHROID)  50 MCG tablet Take 50 mcg by mouth daily before breakfast.    [provider]  Probiotic Product (PROBIOTIC DAILY) CAPS Take 1 tablet by mouth daily. 03/21/20   Karrie Meres, PA-C     Critical care time: n/a     Heber Van Horn, MD [place] [facility] Pager: [phone] Cell: [phone] If no response, call [phone]

## 2020-03-24 NOTE — Progress Notes (Signed)
Patient chart reviewed and case discussed with Graciella Freer PA-C. Patient with one rib fracture and small pneumothorax, from a fall that happened yesterday. Recommended admission for pulmonary toilet, pain control, and repeat chest xray tomorrow. Arrangements made for the same. Notified that patient has since decided to leave AMA. ED to provide incentive spirometer training and any prescriptions, patient should follow up with his PCP or return to ER if further problems.

## 2020-03-24 NOTE — ED Notes (Signed)
Lab called to verify that labs are in process

## 2020-10-05 ENCOUNTER — Other Ambulatory Visit: Payer: Self-pay | Admitting: Orthopedic Surgery

## 2020-10-05 ENCOUNTER — Other Ambulatory Visit: Payer: Self-pay | Admitting: Orthopaedic Surgery

## 2020-10-05 DIAGNOSIS — G8929 Other chronic pain: Secondary | ICD-10-CM

## 2020-10-06 ENCOUNTER — Telehealth: Payer: Self-pay

## 2020-10-06 NOTE — Telephone Encounter (Signed)
[facility] received an order for a myelogram procedure on Charles Stevens. I called and spoke to the patient and he stated "I requested for my doctor to send this order to a different facility". The patient does not wish to have the myelogram done at [facility] at this time.

## 2020-12-29 ENCOUNTER — Other Ambulatory Visit: Payer: Self-pay

## 2020-12-29 ENCOUNTER — Emergency Department (HOSPITAL_BASED_OUTPATIENT_CLINIC_OR_DEPARTMENT_OTHER)
Admission: EM | Admit: 2020-12-29 | Discharge: 2020-12-30 | Disposition: A | Discharge status: Home / Self Care | Payer: Self-pay | Attending: Emergency Medicine | Admitting: Emergency Medicine

## 2020-12-29 ENCOUNTER — Emergency Department (HOSPITAL_BASED_OUTPATIENT_CLINIC_OR_DEPARTMENT_OTHER): Payer: Self-pay

## 2020-12-29 ENCOUNTER — Encounter (HOSPITAL_BASED_OUTPATIENT_CLINIC_OR_DEPARTMENT_OTHER): Payer: Self-pay | Admitting: *Deleted

## 2020-12-29 DIAGNOSIS — S2241XA Multiple fractures of ribs, right side, initial encounter for closed fracture: Secondary | ICD-10-CM | POA: Insufficient documentation

## 2020-12-29 DIAGNOSIS — E039 Hypothyroidism, unspecified: Secondary | ICD-10-CM | POA: Insufficient documentation

## 2020-12-29 DIAGNOSIS — Z87891 Personal history of nicotine dependence: Secondary | ICD-10-CM | POA: Insufficient documentation

## 2020-12-29 MED ORDER — DICLOFENAC SODIUM 1 % EX GEL: 2.0000 g | Freq: Four times a day (QID) | CUTANEOUS | 0 refills | Status: AC | PRN
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
## 2020-12-29 NOTE — Discharge Instructions (Addendum)
Your workup today showed that you have a fracture to one or more ribs.  Unfortunately this type of injury hurts but there is no way to fix it immediately; it must heal over time.  Be sure to take plenty of deep breaths so that you get rid of the "bad air" in your lungs.  If you are given a device called an incentive spirometer, please use it as recommended.  Unless you have been told by your doctor not to do so, we recommend you take ibuprofen 600 mg 3 times daily with meals for no more than 5 days.  You can also take Tylenol 1000 mg every 6 hours for pain.   

## 2020-12-30 NOTE — ED Notes (Signed)
Pt given incentive spirometer and able to demonstrate proper use

## 2023-03-29 ENCOUNTER — Other Ambulatory Visit: Payer: Self-pay | Admitting: Family Medicine

## 2023-03-29 DIAGNOSIS — F17200 Nicotine dependence, unspecified, uncomplicated: Secondary | ICD-10-CM
